# Patient Record
Sex: Female | Born: 1945 | Race: White | Hispanic: No | Marital: Married | Smoking: Never smoker
Health system: Southern US, Community
[De-identification: ages and names within clinical notes are randomized; demographics above are authoritative.]

## PROBLEM LIST (undated history)

## (undated) ENCOUNTER — Ambulatory Visit: Discharge status: Absent without leave | Payer: Medicare Other

## (undated) DIAGNOSIS — M810 Age-related osteoporosis without current pathological fracture: Secondary | ICD-10-CM

## (undated) DIAGNOSIS — I341 Nonrheumatic mitral (valve) prolapse: Secondary | ICD-10-CM

## (undated) DIAGNOSIS — Z9889 Other specified postprocedural states: Secondary | ICD-10-CM

## (undated) DIAGNOSIS — N952 Postmenopausal atrophic vaginitis: Secondary | ICD-10-CM

## (undated) DIAGNOSIS — R609 Edema, unspecified: Secondary | ICD-10-CM

## (undated) DIAGNOSIS — R112 Nausea with vomiting, unspecified: Secondary | ICD-10-CM

## (undated) DIAGNOSIS — M1990 Unspecified osteoarthritis, unspecified site: Secondary | ICD-10-CM

## (undated) HISTORY — PX: TONSILLECTOMY AND ADENOIDECTOMY: SHX28

## (undated) HISTORY — DX: Age-related osteoporosis without current pathological fracture: M81.0

## (undated) HISTORY — DX: Edema, unspecified: R60.9

## (undated) HISTORY — DX: Nonrheumatic mitral (valve) prolapse: I34.1

## (undated) HISTORY — DX: Postmenopausal atrophic vaginitis: N95.2

---

## 1989-07-24 HISTORY — PX: CHOLECYSTECTOMY, LAPAROSCOPIC: SHX56

## 1998-06-22 ENCOUNTER — Other Ambulatory Visit: Admission: RE | Admit: 1998-06-22 | Discharge: 1998-06-22 | Payer: Self-pay | Admitting: Obstetrics and Gynecology

## 1999-07-20 ENCOUNTER — Other Ambulatory Visit: Admission: RE | Admit: 1999-07-20 | Discharge: 1999-07-20 | Payer: Self-pay | Admitting: Obstetrics and Gynecology

## 1999-12-01 ENCOUNTER — Ambulatory Visit (HOSPITAL_BASED_OUTPATIENT_CLINIC_OR_DEPARTMENT_OTHER): Admission: RE | Admit: 1999-12-01 | Discharge: 1999-12-01 | Payer: Self-pay | Admitting: Orthopaedic Surgery

## 1999-12-01 HISTORY — PX: SHOULDER SURGERY: SHX246

## 2000-01-03 ENCOUNTER — Other Ambulatory Visit: Admission: RE | Admit: 2000-01-03 | Discharge: 2000-01-03 | Payer: Self-pay | Admitting: Obstetrics and Gynecology

## 2000-08-29 ENCOUNTER — Other Ambulatory Visit: Admission: RE | Admit: 2000-08-29 | Discharge: 2000-08-29 | Payer: Self-pay | Admitting: Obstetrics and Gynecology

## 2001-10-01 ENCOUNTER — Other Ambulatory Visit: Admission: RE | Admit: 2001-10-01 | Discharge: 2001-10-01 | Payer: Self-pay | Admitting: Obstetrics and Gynecology

## 2003-09-01 ENCOUNTER — Other Ambulatory Visit: Admission: RE | Admit: 2003-09-01 | Discharge: 2003-09-01 | Payer: Self-pay | Admitting: Obstetrics and Gynecology

## 2004-11-22 ENCOUNTER — Other Ambulatory Visit: Admission: RE | Admit: 2004-11-22 | Discharge: 2004-11-22 | Payer: Self-pay | Admitting: Obstetrics and Gynecology

## 2005-11-23 ENCOUNTER — Other Ambulatory Visit: Admission: RE | Admit: 2005-11-23 | Discharge: 2005-11-23 | Payer: Self-pay | Admitting: Obstetrics and Gynecology

## 2006-12-05 ENCOUNTER — Other Ambulatory Visit: Admission: RE | Admit: 2006-12-05 | Discharge: 2006-12-05 | Payer: Self-pay | Admitting: Obstetrics and Gynecology

## 2007-12-17 ENCOUNTER — Other Ambulatory Visit: Admission: RE | Admit: 2007-12-17 | Discharge: 2007-12-17 | Payer: Self-pay | Admitting: Obstetrics and Gynecology

## 2007-12-31 ENCOUNTER — Ambulatory Visit (HOSPITAL_COMMUNITY): Admission: RE | Admit: 2007-12-31 | Discharge: 2007-12-31 | Payer: Self-pay | Admitting: Gastroenterology

## 2007-12-31 ENCOUNTER — Encounter (INDEPENDENT_AMBULATORY_CARE_PROVIDER_SITE_OTHER): Payer: Self-pay | Admitting: Gastroenterology

## 2008-04-14 ENCOUNTER — Ambulatory Visit: Payer: Self-pay | Admitting: Obstetrics and Gynecology

## 2008-12-14 ENCOUNTER — Emergency Department (HOSPITAL_COMMUNITY): Admission: EM | Admit: 2008-12-14 | Discharge: 2008-12-14 | Payer: Self-pay | Admitting: Emergency Medicine

## 2009-06-07 ENCOUNTER — Encounter: Payer: Self-pay | Admitting: Obstetrics and Gynecology

## 2009-06-07 ENCOUNTER — Other Ambulatory Visit: Admission: RE | Admit: 2009-06-07 | Discharge: 2009-06-07 | Payer: Self-pay | Admitting: Obstetrics and Gynecology

## 2009-06-07 ENCOUNTER — Ambulatory Visit: Payer: Self-pay | Admitting: Obstetrics and Gynecology

## 2010-06-08 ENCOUNTER — Other Ambulatory Visit: Admission: RE | Admit: 2010-06-08 | Discharge: 2010-06-08 | Payer: Self-pay | Admitting: Obstetrics and Gynecology

## 2010-06-08 ENCOUNTER — Ambulatory Visit: Payer: Self-pay | Admitting: Obstetrics and Gynecology

## 2010-07-27 ENCOUNTER — Ambulatory Visit
Admission: RE | Admit: 2010-07-27 | Discharge: 2010-07-27 | Payer: Self-pay | Source: Home / Self Care | Attending: Obstetrics and Gynecology | Admitting: Obstetrics and Gynecology

## 2010-12-06 NOTE — Op Note (Signed)
NAMELILLAN, MCCREADIE               ACCOUNT NO.:  [id number]   MEDICAL RECORD NO.:  [id number]          PATIENT TYPE:  AMB   LOCATION:  ENDO                         FACILITY:  [facility]   PHYSICIAN:  Shirley Friar, MDDATE OF BIRTH:  Jun 21, 1946   DATE OF PROCEDURE:  12/31/2007  DATE OF DISCHARGE:                               OPERATIVE REPORT   INDICATIONS:  Heme-positive stool.   MEDICATIONS:  Fentanyl 150 mcg IV and Versed 10 mg IV.   FINDINGS:  Rectal exam was normal.  A pediatric Pentax colonoscope was  inserted to a well-prepped colon and advanced to the cecum where  ileocecal valve and appendiceal orifice were identified.  In order to  reach the cecum, repeated loop reduction was necessary and abdominal  pressure had to be applied at times.  The patient also had to be moved  into the supine position and then back into the left lateral decubitus  position.  Her colon was tortuous and excessive looping occurred.  The  terminal ileum was intubated, and there was a small 1-mm shallow  ulceration with a small amount of surrounding erythema, but without any  edema seen.  The remaining part of the terminal ileum appeared normal.  Two biopsies were taken in the terminal ileum.  On careful withdrawal of  the colonoscope, no other mucosal abnormalities were seen.  Retroflexion  showed small internal hemorrhoids.   ASSESSMENT:  1. Small terminal ileal ulceration, question aspirin related, status      post biopsies of terminal ileal mucosa.  2. Small internal hemorrhoids - suspect heme-positive stools secondary      to internal hemorrhoids.   PLAN:  1. Follow up on pathology.  2. Repeat colonoscopy in 5 years due to the family history of rectal      cancer and colon polyps.      Shirley Friar, MD  Electronically Signed     VCS/MEDQ  D:  12/31/2007  T:  01/01/2008  Job:  [id number]   cc:   Reuel Boom L. Eda Paschal, M.D.

## 2010-12-09 NOTE — Op Note (Signed)
[place]  Patient:    Lauren Thompson, Lauren Thompson                      MRN: [id number] Proc. Date: 12/01/99 Adm. Date:  [id number] Disc. Date: [id number] Attending:  Marcene Corning                           Operative Report  PREOPERATIVE DIAGNOSES: 1. Right shoulder impingement. 2. Right shoulder partial rotator cuff tear.  POSTOPERATIVE DIAGNOSES: 1. Right shoulder impingement. 2. Right shoulder partial rotator cuff tear.  PROCEDURES: 1. Right shoulder arthroscopic acromioplasty. 2. Right shoulder arthroscopic debridement.  ANESTHESIA:  General.  ATTENDING SURGEON:  Lubertha Basque. Jerl Santos, M.D.  ASSISTANT:  Prince Rome, P.A.  INDICATIONS:  The patient is a 65 year old woman with about a year of right shoulder pain of insidious onset.  She has persisted with difficulty despite activity restriction and oral anti-inflammatories.  She has responded in a transient fashion to two subacromial injections, but at this point has continued pain.  At this point, she offered an arthroscopy.  The procedure was discussed with the patient and informed operative consent was obtained after discussion of possible complications of reaction to anesthesia and infection.   DESCRIPTION OF PROCEDURE:  The patient was taken to the operating suite, where general anesthetic was applied without difficulty.  She was positioned in beach chair position and prepped and draped in normal sterile fashion.  After administration of preoperative antibiotics, an arthroscopy of the right shoulder was performed through a total of two portals.  Glenohumeral joint was benign with no degenerative change.  Biceps anchor was well attached, as were other labral structures.  The rotator cuff appeared benign on undersurface inspection.  In the subacromial space, she had some fraying of the rotator cuff, but no full-thickness tear.  A brief debridement was performed.  She had a large  subacromial spur, which was addressed with an arthroscopic subacromial decompression back to a flat surface.  This was done with the bur in the lateral position, followed by transfer of the bur to the posterior position. The [facility] joint appeared benign and there was no significant spurring underneath the structure, so nothing was addressed here.  The cuff was again examined and the shoulder was thoroughly irrigated.  Marcaine with epinephrine and morphine were injected at the end of the case, followed by loose approximation of the two portals with simple sutures of nylon.  Adaptic was placed on the wound, followed by dry gauze and tape.  Estimated blood loss and intraoperative fluids can be obtained from anesthesia records.  DISPOSITION:  The patient was extubated in the operating room and taken to recovery room in stable condition.  PLANS:  For her to go home same day and follow up in the office in less than a week.  I will contact her by phone tonight. DD:  12/01/99 TD:  12/03/99 Job: [id number]

## 2011-05-29 ENCOUNTER — Telehealth: Payer: Self-pay | Admitting: *Deleted

## 2011-05-29 DIAGNOSIS — N390 Urinary tract infection, site not specified: Secondary | ICD-10-CM

## 2011-05-29 MED ORDER — NITROFURANTOIN MONOHYD MACRO 100 MG PO CAPS: 100.0000 mg | ORAL_CAPSULE | Freq: Two times a day (BID) | ORAL | Status: AC

## 2011-05-29 NOTE — Telephone Encounter (Signed)
If not allergic Macrobid twice a day with food for 7 days. Patient needs followup urinalysis.

## 2011-05-29 NOTE — Telephone Encounter (Signed)
Patient informed.  RX sent, order for U/A in pc.

## 2011-05-29 NOTE — Telephone Encounter (Signed)
Patient called c/o frequency, urine odor, etc.  Would like an rx called in if possible.

## 2011-06-30 ENCOUNTER — Other Ambulatory Visit: Payer: Self-pay | Admitting: *Deleted

## 2011-06-30 ENCOUNTER — Other Ambulatory Visit: Payer: BC Managed Care – PPO | Admitting: Gynecology

## 2011-06-30 DIAGNOSIS — N390 Urinary tract infection, site not specified: Secondary | ICD-10-CM

## 2011-06-30 MED ORDER — ZOLPIDEM TARTRATE 10 MG PO TABS: 10.0000 mg | ORAL_TABLET | Freq: Every evening | ORAL | Status: DC | PRN

## 2011-06-30 NOTE — Telephone Encounter (Signed)
rx called in

## 2011-07-03 ENCOUNTER — Telehealth: Payer: Self-pay | Admitting: *Deleted

## 2011-07-03 NOTE — Telephone Encounter (Signed)
Patient called for u/a results from 06/30/11. All negative.

## 2011-08-08 ENCOUNTER — Encounter: Payer: Self-pay | Admitting: Obstetrics and Gynecology

## 2011-08-11 ENCOUNTER — Other Ambulatory Visit: Payer: Self-pay | Admitting: Obstetrics and Gynecology

## 2011-09-07 DIAGNOSIS — N952 Postmenopausal atrophic vaginitis: Secondary | ICD-10-CM | POA: Insufficient documentation

## 2011-09-07 DIAGNOSIS — M8580 Other specified disorders of bone density and structure, unspecified site: Secondary | ICD-10-CM | POA: Insufficient documentation

## 2011-09-15 ENCOUNTER — Ambulatory Visit (INDEPENDENT_AMBULATORY_CARE_PROVIDER_SITE_OTHER): Payer: BC Managed Care – PPO | Admitting: Obstetrics and Gynecology

## 2011-09-15 ENCOUNTER — Encounter: Payer: Self-pay | Admitting: Obstetrics and Gynecology

## 2011-09-15 VITALS — BP 120/70 | Ht 64.5 in | Wt 125.0 lb

## 2011-09-15 DIAGNOSIS — Z01419 Encounter for gynecological examination (general) (routine) without abnormal findings: Secondary | ICD-10-CM

## 2011-09-15 DIAGNOSIS — M8580 Other specified disorders of bone density and structure, unspecified site: Secondary | ICD-10-CM

## 2011-09-15 DIAGNOSIS — Z833 Family history of diabetes mellitus: Secondary | ICD-10-CM

## 2011-09-15 DIAGNOSIS — M899 Disorder of bone, unspecified: Secondary | ICD-10-CM

## 2011-09-15 DIAGNOSIS — I341 Nonrheumatic mitral (valve) prolapse: Secondary | ICD-10-CM | POA: Insufficient documentation

## 2011-09-15 NOTE — Progress Notes (Signed)
Patient came to see me today for her annual GYN exam. She is not having hot flashes. She is not having vaginal bleeding. She is having no pelvic pain. She is up-to-date on her mammograms but we did not receive her last one. She is due for followup bone density for osteopenia. She has been dry vaginally with intercourse but her husband has had prostate cancer at the moment she is not sexually active.  Physical examination:  Kennon Portela present. HEENT within normal limits. Neck: Thyroid not large. No masses. Supraclavicular nodes: not enlarged. Breasts: Examined in both sitting midline position. No skin changes and no masses. Abdomen: Soft no guarding rebound or masses or hernia. Pelvic: External: Within normal limits. BUS: Within normal limits. Vaginal:within normal limits. Poor estrogen effect. No evidence of cystocele rectocele or enterocele. Cervix: clean. Uterus: Normal size and shape. Adnexa: No masses. Rectovaginal exam: Confirmatory and negative. Extremities: Within normal limits.  Assessment: #1. Atrophic vaginitis #2. Osteopenia  Plan: Patient to get me her mammogram report. Bone density ordered.

## 2011-09-16 LAB — CBC WITH DIFFERENTIAL/PLATELET
Basophils Absolute: 0 10*3/uL (ref 0.0–0.1)
Eosinophils Absolute: 0 10*3/uL (ref 0.0–0.7)
Eosinophils Relative: 0 % (ref 0–5)
Lymphocytes Relative: 23 % (ref 12–46)
Lymphs Abs: 2 10*3/uL (ref 0.7–4.0)
MCH: 27.8 pg (ref 26.0–34.0)
Neutrophils Relative %: 70 % (ref 43–77)
Platelets: 248 10*3/uL (ref 150–400)
RBC: 4.54 MIL/uL (ref 3.87–5.11)
RDW: 13.3 % (ref 11.5–15.5)
WBC: 8.4 10*3/uL (ref 4.0–10.5)

## 2011-09-16 LAB — URINALYSIS W MICROSCOPIC + REFLEX CULTURE
Casts: NONE SEEN
Crystals: NONE SEEN
Leukocytes, UA: NEGATIVE
Nitrite: NEGATIVE
Specific Gravity, Urine: 1.02 (ref 1.005–1.030)
Urobilinogen, UA: 0.2 mg/dL (ref 0.0–1.0)
pH: 6 (ref 5.0–8.0)

## 2011-09-16 LAB — LIPID PANEL
HDL: 88 mg/dL (ref >39)
Total CHOL/HDL Ratio: 2.3 Ratio
VLDL: 8 mg/dL (ref 0–40)

## 2011-09-16 LAB — HEMOGLOBIN A1C: Hgb A1c MFr Bld: 5.3 % (ref <5.7)

## 2011-09-21 ENCOUNTER — Telehealth: Payer: Self-pay | Admitting: *Deleted

## 2011-09-21 NOTE — Telephone Encounter (Signed)
Lm for patient.  Wants to discuss lab results

## 2011-09-22 NOTE — Telephone Encounter (Signed)
Went over results with patient. Questions answered

## 2011-09-26 ENCOUNTER — Ambulatory Visit (INDEPENDENT_AMBULATORY_CARE_PROVIDER_SITE_OTHER): Payer: BC Managed Care – PPO

## 2011-09-26 DIAGNOSIS — M899 Disorder of bone, unspecified: Secondary | ICD-10-CM

## 2011-09-26 DIAGNOSIS — M949 Disorder of cartilage, unspecified: Secondary | ICD-10-CM

## 2011-09-26 DIAGNOSIS — M8580 Other specified disorders of bone density and structure, unspecified site: Secondary | ICD-10-CM

## 2011-11-30 ENCOUNTER — Other Ambulatory Visit: Payer: Self-pay | Admitting: Obstetrics and Gynecology

## 2011-12-13 ENCOUNTER — Telehealth: Payer: Self-pay | Admitting: *Deleted

## 2011-12-13 MED ORDER — TRIAMTERENE-HCTZ 37.5-25 MG PO CAPS: 1.0000 | ORAL_CAPSULE | Freq: Every day | ORAL | Status: DC

## 2011-12-13 NOTE — Telephone Encounter (Signed)
Pt left message on 12/11/11 request rx for fluid pill? Left message on voicemail for pt to call.

## 2011-12-13 NOTE — Telephone Encounter (Signed)
Addended by: Aura Camps on: 12/13/2011 04:25 PM   Modules accepted: Orders

## 2011-12-13 NOTE — Telephone Encounter (Signed)
Dyazide 1 daily when necessary. Give #30 with no refills.

## 2011-12-13 NOTE — Telephone Encounter (Signed)
rx sent to pharmacy

## 2011-12-13 NOTE — Telephone Encounter (Signed)
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 Pt said that you said a fluid pill would help with her ankles. She is calling requesting to start on a pill, I asked her about PCP doctor and pt has not seen him in sometime. Please advise

## 2012-04-09 ENCOUNTER — Other Ambulatory Visit: Payer: Self-pay | Admitting: Women's Health

## 2012-04-09 NOTE — Telephone Encounter (Signed)
Lauren Thompson, refill request came from [facility] via fax.  In system I see where you prescribed #30 with no refills last Dec.  The fax says "Last refill 10/04/2011".

## 2012-04-10 MED ORDER — ZOLPIDEM TARTRATE 10 MG PO TABS: 10.0000 mg | ORAL_TABLET | Freq: Every evening | ORAL | Status: DC | PRN

## 2012-04-10 NOTE — Telephone Encounter (Signed)
Okay to refill, she may have gotten filled in March and not in December. Please call in Xanax 0.25 every 8 hours when necessary #30 no refills.

## 2012-04-10 NOTE — Telephone Encounter (Signed)
I had handwritten note from [place] to disregard Rx for Xanax. Xanax was NOT called in.

## 2013-09-08 DIAGNOSIS — Z1231 Encounter for screening mammogram for malignant neoplasm of breast: Secondary | ICD-10-CM | POA: Diagnosis not present

## 2013-09-10 DIAGNOSIS — B351 Tinea unguium: Secondary | ICD-10-CM | POA: Diagnosis not present

## 2013-09-10 DIAGNOSIS — L259 Unspecified contact dermatitis, unspecified cause: Secondary | ICD-10-CM | POA: Diagnosis not present

## 2013-09-10 DIAGNOSIS — L819 Disorder of pigmentation, unspecified: Secondary | ICD-10-CM | POA: Diagnosis not present

## 2013-09-22 ENCOUNTER — Ambulatory Visit: Payer: Self-pay | Admitting: Nurse Practitioner

## 2013-10-06 ENCOUNTER — Encounter: Payer: Self-pay | Admitting: Nurse Practitioner

## 2013-10-06 ENCOUNTER — Ambulatory Visit (INDEPENDENT_AMBULATORY_CARE_PROVIDER_SITE_OTHER): Payer: Medicare Other | Admitting: Nurse Practitioner

## 2013-10-06 VITALS — BP 116/78 | HR 60 | Ht 65.0 in | Wt 130.0 lb

## 2013-10-06 DIAGNOSIS — Z0000 Encounter for general adult medical examination without abnormal findings: Secondary | ICD-10-CM

## 2013-10-06 DIAGNOSIS — E559 Vitamin D deficiency, unspecified: Secondary | ICD-10-CM

## 2013-10-06 DIAGNOSIS — Z01419 Encounter for gynecological examination (general) (routine) without abnormal findings: Secondary | ICD-10-CM

## 2013-10-06 DIAGNOSIS — Z124 Encounter for screening for malignant neoplasm of cervix: Secondary | ICD-10-CM | POA: Diagnosis not present

## 2013-10-06 LAB — POCT URINALYSIS DIPSTICK
BILIRUBIN UA: NEGATIVE
Blood, UA: NEGATIVE
GLUCOSE UA: NEGATIVE
Ketones, UA: NEGATIVE
LEUKOCYTES UA: NEGATIVE
NITRITE UA: NEGATIVE
Protein, UA: NEGATIVE
Urobilinogen, UA: NEGATIVE
pH, UA: 7

## 2013-10-06 MED ORDER — VITAMIN D (ERGOCALCIFEROL) 1.25 MG (50000 UNIT) PO CAPS: ORAL_CAPSULE | ORAL | Status: AC

## 2013-10-06 NOTE — Progress Notes (Signed)
Patient ID: Lauren Thompson, female   DOB: 04/13/1946, 68 y.o.   MRN: [id number] 68 y.o. [id number] Married Caucasian Fe here for annual exam.  Husband with prostate cancer 3 years. No other new health concerns.  She and her husband are going to [place] for a week course this summer and will include some travel.  Not SA at present. At PCP [facility] she was down in weight to 123 lbs. She has tried to gain a few pounds since then.   Patient's last menstrual period was 07/24/2000.          Sexually active: yes  The current method of family planning is post menopausal status.    Exercising: yes  walking at least 2 miles everyday Smoker:  no  Health Maintenance: Pap:   09/17/12, normal with negative HR HPV MMG:  09/08/13, 3 D: negative Colonoscopy:  12/2007, repeat in 5 years - scheduled 10/15/13 BMD:  09/26/11, Low Bone Mass TDaP:  2010 Labs:  HB:  12.6  Urine:  negative   reports that she has never smoked. She has never used smokeless tobacco. She reports that she drinks alcohol.  Past Medical History  Diagnosis Date  . Atrophic vaginitis   . Osteopenia   . MVP (mitral valve prolapse)     Antibiotics are NOT require for procedures  . Idiopathic edema     fluid retention    Past Surgical History  Procedure Laterality Date  . Tonsillectomy and adenoidectomy  childhood  . Cholecystectomy, laparoscopic  1991  . Shoulder surgery Right 12/01/1999    Current Outpatient Prescriptions  Medication Sig Dispense Refill  . aspirin 81 MG tablet Take 81 mg by mouth daily.      . Calcium Carbonate-Vitamin D (CALCIUM-D PO) Take by mouth.      . triamterene-hydrochlorothiazide (DYAZIDE) 37.5-25 MG per capsule Take 1 capsule by mouth daily as needed (swelling).      . Vitamin D, Ergocalciferol, (DRISDOL) 50000 UNITS CAPS capsule TAKE 1 CAPSULE BY MOUTH ONCE WEEKLY  30 capsule  3   No current facility-administered medications for this visit.    Family History  Problem Relation Age of Onset  . Diabetes  Mother   . Hypertension Mother   . Stroke Mother   . Heart disease Father   . Colon cancer Father   . Alzheimer's disease Father   . Stroke Maternal Grandmother   . Diabetes Paternal Grandmother     ROS:  Pertinent items are noted in HPI.  Otherwise, a comprehensive ROS was negative.  Exam:   BP 116/78  Pulse 60  Ht 5\' 5"  (1.651 m)  Wt 130 lb (58.968 kg)  BMI 21.63 kg/m2  LMP 07/24/2000 Height: 5\' 5"  (165.1 cm)  Ht Readings from Last 3 Encounters:  10/06/13 5\' 5"  (1.651 m)  09/15/11 5' 4.5" (1.638 m)    General appearance: alert, cooperative and appears stated age Head: Normocephalic, without obvious abnormality, atraumatic Neck: no adenopathy, supple, symmetrical, trachea midline and thyroid normal to inspection and palpation Lungs: clear to auscultation bilaterally Breasts: normal appearance, no masses or tenderness Heart: regular rate and rhythm Abdomen: soft, non-tender; no masses,  no organomegaly Extremities: extremities normal, atraumatic, no cyanosis or edema Skin: Skin color, texture, turgor normal. No rashes or lesions Lymph nodes: Cervical, supraclavicular, and axillary nodes normal. No abnormal inguinal nodes palpated Neurologic: Grossly normal   Pelvic: External genitalia:  no lesions              Urethra:  normal appearing urethra with no masses, tenderness or lesions              Bartholin's and Skene's: normal                 Vagina: very atrophic appearing vagina with pale color and discharge, no lesions;  had to use smallest speculum and still uncomfortable with exam.              Cervix: anteverted              Pap taken: no Bimanual Exam:  Uterus:  normal size, contour, position, consistency, mobility, non-tender              Adnexa: no mass, fullness, tenderness               Rectovaginal: Confirms               Anus:  normal sphincter tone, no lesions  A:  Well Woman with normal exam  Postmenopausal no HRT  Atrophic vaginitis declines  treatment  Vit D deficiency  P:   Pap smear as per guidelines   Mammogram due 08/2014  Refill Vit D and follow with results  Counseled on breast self exam, mammography screening, adequate intake of calcium and vitamin D, diet and exercise, Kegel's exercises return annually or prn  An After Visit Summary was printed and given to the patient.

## 2013-10-06 NOTE — Patient Instructions (Signed)

## 2013-10-07 LAB — HEMOGLOBIN, FINGERSTICK: Hemoglobin, fingerstick: 12.6 g/dL (ref 12.0–16.0)

## 2013-10-07 LAB — VITAMIN D 25 HYDROXY (VIT D DEFICIENCY, FRACTURES): VIT D 25 HYDROXY: 26 ng/mL — AB (ref 30–89)

## 2013-10-07 NOTE — Progress Notes (Signed)
Encounter reviewed by Dr. Benicia Bergevin Silva.  

## 2013-10-15 ENCOUNTER — Telehealth: Payer: Self-pay | Admitting: *Deleted

## 2013-10-15 DIAGNOSIS — K621 Rectal polyp: Secondary | ICD-10-CM | POA: Diagnosis not present

## 2013-10-15 DIAGNOSIS — D126 Benign neoplasm of colon, unspecified: Secondary | ICD-10-CM | POA: Diagnosis not present

## 2013-10-15 DIAGNOSIS — D128 Benign neoplasm of rectum: Secondary | ICD-10-CM | POA: Diagnosis not present

## 2013-10-15 DIAGNOSIS — K620 Anal polyp: Secondary | ICD-10-CM | POA: Diagnosis not present

## 2013-10-15 DIAGNOSIS — Z800 Family history of malignant neoplasm of digestive organs: Secondary | ICD-10-CM | POA: Diagnosis not present

## 2013-10-15 DIAGNOSIS — Z1211 Encounter for screening for malignant neoplasm of colon: Secondary | ICD-10-CM | POA: Diagnosis not present

## 2013-10-15 DIAGNOSIS — D129 Benign neoplasm of anus and anal canal: Secondary | ICD-10-CM | POA: Diagnosis not present

## 2013-10-15 NOTE — Telephone Encounter (Signed)
I have attempted to contact this patient by phone with the following results: left message to return my call on answering machine (home per DPR).  

## 2013-10-15 NOTE — Telephone Encounter (Signed)
Message copied by Graylon Good on Wed Oct 15, 2013 10:04 AM ------      Message from: Kem Boroughs R      Created: Tue Oct 07, 2013  8:19 AM       Let patient know that Vit D is quite low - she had been out of meds for a month.  She needs to restart weekly and RX already given. ------

## 2013-10-15 NOTE — Telephone Encounter (Signed)
Pt notified of results.  Pt voices understanding and is agreeable with plan.  Verified RX has been called to [facility].  She will pick up from there.

## 2013-12-01 DIAGNOSIS — B079 Viral wart, unspecified: Secondary | ICD-10-CM | POA: Diagnosis not present

## 2013-12-01 DIAGNOSIS — D485 Neoplasm of uncertain behavior of skin: Secondary | ICD-10-CM | POA: Diagnosis not present

## 2013-12-01 DIAGNOSIS — B351 Tinea unguium: Secondary | ICD-10-CM | POA: Diagnosis not present

## 2014-01-01 DIAGNOSIS — H2510 Age-related nuclear cataract, unspecified eye: Secondary | ICD-10-CM | POA: Diagnosis not present

## 2014-01-01 DIAGNOSIS — H43399 Other vitreous opacities, unspecified eye: Secondary | ICD-10-CM | POA: Diagnosis not present

## 2014-02-23 DIAGNOSIS — H524 Presbyopia: Secondary | ICD-10-CM | POA: Diagnosis not present

## 2014-03-25 ENCOUNTER — Ambulatory Visit: Payer: Medicare Other | Admitting: Certified Nurse Midwife

## 2014-04-21 ENCOUNTER — Encounter (HOSPITAL_BASED_OUTPATIENT_CLINIC_OR_DEPARTMENT_OTHER): Payer: Self-pay | Admitting: *Deleted

## 2014-04-21 ENCOUNTER — Other Ambulatory Visit: Payer: Self-pay | Admitting: Orthopedic Surgery

## 2014-04-21 DIAGNOSIS — M19049 Primary osteoarthritis, unspecified hand: Secondary | ICD-10-CM | POA: Diagnosis not present

## 2014-04-24 ENCOUNTER — Encounter (HOSPITAL_BASED_OUTPATIENT_CLINIC_OR_DEPARTMENT_OTHER)
Admission: RE | Admit: 2014-04-24 | Discharge: 2014-04-24 | Disposition: A | Discharge status: Home / Self Care | Payer: Medicare Other | Source: Ambulatory Visit | Attending: Orthopedic Surgery | Admitting: Orthopedic Surgery

## 2014-04-24 DIAGNOSIS — I341 Nonrheumatic mitral (valve) prolapse: Secondary | ICD-10-CM | POA: Diagnosis not present

## 2014-04-24 DIAGNOSIS — Z882 Allergy status to sulfonamides status: Secondary | ICD-10-CM | POA: Diagnosis not present

## 2014-04-24 DIAGNOSIS — R609 Edema, unspecified: Secondary | ICD-10-CM | POA: Diagnosis not present

## 2014-04-24 DIAGNOSIS — M19041 Primary osteoarthritis, right hand: Secondary | ICD-10-CM | POA: Diagnosis not present

## 2014-04-24 DIAGNOSIS — M8580 Other specified disorders of bone density and structure, unspecified site: Secondary | ICD-10-CM | POA: Diagnosis not present

## 2014-04-24 LAB — BASIC METABOLIC PANEL
Anion gap: 9 (ref 5–15)
BUN: 14 mg/dL (ref 6–23)
CO2: 29 mEq/L (ref 19–32)
CREATININE: 0.7 mg/dL (ref 0.50–1.10)
Calcium: 9 mg/dL (ref 8.4–10.5)
Chloride: 103 mEq/L (ref 96–112)
GFR, EST NON AFRICAN AMERICAN: 88 mL/min — AB (ref >90)
Glucose, Bld: 81 mg/dL (ref 70–99)
Potassium: 4.4 mEq/L (ref 3.7–5.3)
Sodium: 141 mEq/L (ref 137–147)

## 2014-04-24 NOTE — H&P (Signed)
Lauren Thompson is an 68 y.o. female.   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  She has developed progressive pain in the DIP joint of the finger as well as some enlargement and radial deviation.  It has been chronic and progressive over 3 years.  She uses ibuprofen intermittently.  There are times when she is really bothered by pain in the digit when it is bumped or caught unexpectedly.  She has also begun to develop some enlargement about the left small finger DIP joints as well.  Past Medical History  Diagnosis Date  . Atrophic vaginitis   . Osteopenia   . MVP (mitral valve prolapse)     Antibiotics are NOT require for procedures  . Idiopathic edema     fluid retention  . PONV (postoperative nausea and vomiting)   . Arthritis     rt index finger    Past Surgical History  Procedure Laterality Date  . Tonsillectomy and adenoidectomy  childhood  . Cholecystectomy, laparoscopic  1991  . Shoulder surgery Right 12/01/1999  . Tonsillectomy    . Cholecystectomy      Family History  Problem Relation Age of Onset  . Diabetes Mother   . Hypertension Mother   . Stroke Mother   . Heart disease Father   . Colon cancer Father   . Alzheimer's disease Father   . Stroke Maternal Grandmother   . Diabetes Paternal Grandmother    Social History:  reports that she has never smoked. She has never used smokeless tobacco. She reports that she drinks alcohol. She reports that she does not use illicit drugs.  Allergies:  Allergies  Allergen Reactions  . Sulfa Antibiotics     No prescriptions prior to admission    Results for orders placed during the hospital encounter of 04/27/14 (from the past 48 hour(s))  BASIC METABOLIC PANEL     Status: Abnormal   Collection Time    04/24/14  9:30 AM      Result Value Ref Range   Sodium 141  137 - 147 mEq/L   Potassium 4.4   3.7 - 5.3 mEq/L   Chloride 103  96 - 112 mEq/L   CO2 29  19 - 32 mEq/L   Glucose, Bld 81  70 - 99 mg/dL   BUN 14  6 - 23 mg/dL   Creatinine, Ser 0.70  0.50 - 1.10 mg/dL   Calcium 9.0  8.4 - 10.5 mg/dL   GFR calc non Af Amer 88 (*) >90 mL/min   GFR calc Af Amer >90  >90 mL/min   Comment: (NOTE)     The eGFR has been calculated using the CKD EPI equation.     This calculation has not been validated in all clinical situations.     eGFR's persistently <90 mL/min signify possible Chronic Kidney     Disease.   Anion gap 9  5 - 15   No results found.  Review of Systems  All other systems reviewed and are negative.   Height _0  (1.676 m), weight 57.153 kg (126 lb), last menstrual period 07/24/2000. Physical Exam  Constitutional:  WD, WN, NAD HEENT:  NCAT, EOMI Neuro/Psych:  Alert & oriented to person, place, and time; appropriate mood & affect Lymphatic: No generalized UE edema or lymphadenopathy Extremities / MSK:  Both UE are normal with respect to appearance,  ranges of motion, joint stability, muscle strength/tone, sensation, & perfusion except as otherwise noted:  Right index finger DIP joint is enlarged and a characteristic manner for osteoarthritis.  There is roughly 6-7 of radial angulation and range of motion is just from 0-35.  Tip pinch: Right 3/left 7  Labs / Xrays:  3 views of the right index finger ordered and obtained today reveal extensive osteophytic change, with extensive osteophyte formation, joint space narrowing, and articular destruction.  Assessment: Right index finger painful DIP osteoarthritis, with accompanying deformity  Plan:  I discussed options with her that included nonsteroidals, and intermittent injections, and DIP fusion.  After deliberation of the options and considerations of the details of each, she indicated she wished to proceed with DIP fusion.  She understands the extent of postoperative splinting and the retained longitudinal screw within the  bones.  She transitioned to our surgery scheduler today to further plan conducting this procedure.    The details of the operative procedure were discussed with the patient.  Questions were invited and answered.  In addition to the goal of the procedure, the risks of the procedure to include but not limited to bleeding; infection; damage to the nerves or blood vessels that could result in bleeding, numbness, weakness, chronic pain, and the need for additional procedures; stiffness; the need for revision surgery; and anesthetic risks, the worst of which is death.  No specific outcome was guaranteed or implied.  Informed consent was obtained.   Lauren Thompson A. 04/24/2014, 2:59 PM

## 2014-04-27 ENCOUNTER — Encounter (HOSPITAL_BASED_OUTPATIENT_CLINIC_OR_DEPARTMENT_OTHER)
Admission: RE | Disposition: A | Discharge status: Home / Self Care | Payer: Self-pay | Source: Ambulatory Visit | Attending: Orthopedic Surgery

## 2014-04-27 ENCOUNTER — Ambulatory Visit (HOSPITAL_BASED_OUTPATIENT_CLINIC_OR_DEPARTMENT_OTHER): Payer: Medicare Other | Admitting: Certified Registered"

## 2014-04-27 ENCOUNTER — Encounter (HOSPITAL_BASED_OUTPATIENT_CLINIC_OR_DEPARTMENT_OTHER): Payer: Self-pay

## 2014-04-27 ENCOUNTER — Encounter (HOSPITAL_BASED_OUTPATIENT_CLINIC_OR_DEPARTMENT_OTHER): Payer: Medicare Other | Admitting: Certified Registered"

## 2014-04-27 ENCOUNTER — Ambulatory Visit (HOSPITAL_BASED_OUTPATIENT_CLINIC_OR_DEPARTMENT_OTHER)
Admission: RE | Admit: 2014-04-27 | Discharge: 2014-04-27 | Disposition: A | Discharge status: Home / Self Care | Payer: Medicare Other | Source: Ambulatory Visit | Attending: Orthopedic Surgery | Admitting: Orthopedic Surgery

## 2014-04-27 ENCOUNTER — Ambulatory Visit (HOSPITAL_COMMUNITY): Payer: Medicare Other

## 2014-04-27 DIAGNOSIS — M8580 Other specified disorders of bone density and structure, unspecified site: Secondary | ICD-10-CM | POA: Diagnosis not present

## 2014-04-27 DIAGNOSIS — Z472 Encounter for removal of internal fixation device: Secondary | ICD-10-CM | POA: Diagnosis not present

## 2014-04-27 DIAGNOSIS — Z882 Allergy status to sulfonamides status: Secondary | ICD-10-CM | POA: Insufficient documentation

## 2014-04-27 DIAGNOSIS — R609 Edema, unspecified: Secondary | ICD-10-CM | POA: Insufficient documentation

## 2014-04-27 DIAGNOSIS — M19041 Primary osteoarthritis, right hand: Secondary | ICD-10-CM | POA: Insufficient documentation

## 2014-04-27 DIAGNOSIS — I341 Nonrheumatic mitral (valve) prolapse: Secondary | ICD-10-CM | POA: Diagnosis not present

## 2014-04-27 HISTORY — PX: DISTAL INTERPHALANGEAL JOINT FUSION: SHX6428

## 2014-04-27 HISTORY — DX: Other specified postprocedural states: Z98.890

## 2014-04-27 HISTORY — DX: Unspecified osteoarthritis, unspecified site: M19.90

## 2014-04-27 HISTORY — DX: Nausea with vomiting, unspecified: R11.2

## 2014-04-27 LAB — POCT HEMOGLOBIN-HEMACUE: Hemoglobin: 13.4 g/dL (ref 12.0–15.0)

## 2014-04-27 SURGERY — DISTAL INTERPHALANGEAL JOINT FUSION
Anesthesia: General | Site: Finger | Laterality: Right

## 2014-04-27 MED ORDER — BUPIVACAINE-EPINEPHRINE (PF) 0.5% -1:200000 IJ SOLN
INTRAMUSCULAR | Status: DC | PRN
  Administered 2014-04-27: 10 mL via PERINEURAL

## 2014-04-27 MED ORDER — BUPIVACAINE HCL (PF) 0.5 % IJ SOLN
INTRAMUSCULAR | Status: AC
  Filled 2014-04-27: qty 30

## 2014-04-27 MED ORDER — 0.9 % SODIUM CHLORIDE (POUR BTL) OPTIME
TOPICAL | Status: DC | PRN
  Administered 2014-04-27: 300 mL

## 2014-04-27 MED ORDER — CEFAZOLIN SODIUM-DEXTROSE 2-3 GM-% IV SOLR: 2.0000 g | INTRAVENOUS | Status: DC

## 2014-04-27 MED ORDER — FENTANYL CITRATE 0.05 MG/ML IJ SOLN
INTRAMUSCULAR | Status: AC
  Filled 2014-04-27: qty 6

## 2014-04-27 MED ORDER — CEFAZOLIN SODIUM-DEXTROSE 2-3 GM-% IV SOLR
INTRAVENOUS | Status: DC | PRN
  Administered 2014-04-27: 2 g via INTRAVENOUS

## 2014-04-27 MED ORDER — LACTATED RINGERS IV SOLN: INTRAVENOUS | Status: DC

## 2014-04-27 MED ORDER — EPHEDRINE SULFATE 50 MG/ML IJ SOLN
INTRAMUSCULAR | Status: DC | PRN
  Administered 2014-04-27: 10 mg via INTRAVENOUS

## 2014-04-27 MED ORDER — MIDAZOLAM HCL 2 MG/2ML IJ SOLN: 1.0000 mg | INTRAMUSCULAR | Status: DC | PRN

## 2014-04-27 MED ORDER — MIDAZOLAM HCL 5 MG/5ML IJ SOLN
INTRAMUSCULAR | Status: DC | PRN
  Administered 2014-04-27: 2 mg via INTRAVENOUS

## 2014-04-27 MED ORDER — SCOPOLAMINE 1 MG/3DAYS TD PT72
MEDICATED_PATCH | TRANSDERMAL | Status: AC
  Filled 2014-04-27: qty 1

## 2014-04-27 MED ORDER — FENTANYL CITRATE 0.05 MG/ML IJ SOLN: 50.0000 ug | INTRAMUSCULAR | Status: DC | PRN

## 2014-04-27 MED ORDER — LACTATED RINGERS IV SOLN
INTRAVENOUS | Status: DC
  Administered 2014-04-27 (×2): via INTRAVENOUS

## 2014-04-27 MED ORDER — OXYCODONE HCL 5 MG PO TABS: 5.0000 mg | ORAL_TABLET | Freq: Once | ORAL | Status: DC | PRN

## 2014-04-27 MED ORDER — PROPOFOL 10 MG/ML IV BOLUS
INTRAVENOUS | Status: DC | PRN
  Administered 2014-04-27: 120 mg via INTRAVENOUS

## 2014-04-27 MED ORDER — LIDOCAINE HCL (CARDIAC) 20 MG/ML IV SOLN
INTRAVENOUS | Status: DC | PRN
  Administered 2014-04-27: 50 mg via INTRAVENOUS

## 2014-04-27 MED ORDER — MIDAZOLAM HCL 2 MG/2ML IJ SOLN
INTRAMUSCULAR | Status: AC
  Filled 2014-04-27: qty 2

## 2014-04-27 MED ORDER — BUPIVACAINE-EPINEPHRINE (PF) 0.5% -1:200000 IJ SOLN
INTRAMUSCULAR | Status: AC
  Filled 2014-04-27: qty 30

## 2014-04-27 MED ORDER — ONDANSETRON HCL 4 MG/2ML IJ SOLN
INTRAMUSCULAR | Status: DC | PRN
  Administered 2014-04-27: 4 mg via INTRAVENOUS

## 2014-04-27 MED ORDER — OXYCODONE HCL 5 MG/5ML PO SOLN: 5.0000 mg | Freq: Once | ORAL | Status: DC | PRN

## 2014-04-27 MED ORDER — SCOPOLAMINE 1 MG/3DAYS TD PT72SCOPOLAMINE 1 MG/3DAYS
1.0000 | MEDICATED_PATCH | Freq: Once | TRANSDERMAL | Status: DC
Start: 2014-04-27 — End: 2014-04-27
  Administered 2014-04-27: 1.5 mg via TRANSDERMAL

## 2014-04-27 MED ORDER — DEXAMETHASONE SODIUM PHOSPHATE 4 MG/ML IJ SOLN
INTRAMUSCULAR | Status: DC | PRN
  Administered 2014-04-27: 10 mg via INTRAVENOUS

## 2014-04-27 MED ORDER — HYDROMORPHONE HCL 1 MG/ML IJ SOLN: 0.2500 mg | INTRAMUSCULAR | Status: DC | PRN

## 2014-04-27 MED ORDER — HYDROCODONE-ACETAMINOPHEN 5-325 MG PO TABS: 1.0000 | ORAL_TABLET | ORAL | Status: DC | PRN

## 2014-04-27 MED ORDER — LIDOCAINE HCL (PF) 1 % IJ SOLN
INTRAMUSCULAR | Status: AC
  Filled 2014-04-27: qty 30

## 2014-04-27 MED ORDER — FENTANYL CITRATE 0.05 MG/ML IJ SOLN
INTRAMUSCULAR | Status: DC | PRN
  Administered 2014-04-27 (×2): 50 ug via INTRAVENOUS

## 2014-04-27 SURGICAL SUPPLY — 57 items
BANDAGE COBAN STERILE 2 (GAUZE/BANDAGES/DRESSINGS) IMPLANT
BLADE AVERAGE 25MMX9MM (BLADE)
BLADE AVERAGE 25X9 (BLADE) IMPLANT
BLADE MINI RND TIP GREEN BEAV (BLADE) ×3 IMPLANT
BLADE SURG 15 STRL LF DISP TIS (BLADE) ×1 IMPLANT
BLADE SURG 15 STRL SS (BLADE) ×2
BNDG COHESIVE 1X5 TAN STRL LF (GAUZE/BANDAGES/DRESSINGS) ×3 IMPLANT
BNDG COHESIVE 4X5 TAN STRL (GAUZE/BANDAGES/DRESSINGS) IMPLANT
BNDG CONFORM 2 STRL LF (GAUZE/BANDAGES/DRESSINGS) ×3 IMPLANT
BNDG ESMARK 4X9 LF (GAUZE/BANDAGES/DRESSINGS) ×3 IMPLANT
BNDG GAUZE ELAST 4 BULKY (GAUZE/BANDAGES/DRESSINGS) IMPLANT
BUR ROUND CARBIDE (BURR) IMPLANT
CHLORAPREP W/TINT 26ML (MISCELLANEOUS) ×3 IMPLANT
CORDS BIPOLAR (ELECTRODE) ×3 IMPLANT
COVER MAYO STAND STRL (DRAPES) ×3 IMPLANT
COVER TABLE BACK 60X90 (DRAPES) ×3 IMPLANT
CUFF TOURNIQUET SINGLE 18IN (TOURNIQUET CUFF) ×3 IMPLANT
DEPRESSOR TONGUE BLADE STERILE (MISCELLANEOUS) ×3 IMPLANT
DRAPE C-ARM 42X72 X-RAY (DRAPES) ×3 IMPLANT
DRAPE EXTREMITY TIBURON (DRAPES) ×3 IMPLANT
DRAPE SURG 17X23 STRL (DRAPES) ×3 IMPLANT
DRSG EMULSION OIL 3X3 NADH (GAUZE/BANDAGES/DRESSINGS) ×3 IMPLANT
ELECT REM PT RETURN 9FT ADLT (ELECTROSURGICAL)
ELECTRODE REM PT RTRN 9FT ADLT (ELECTROSURGICAL) IMPLANT
GAUZE SPONGE 4X4 12PLY STRL (GAUZE/BANDAGES/DRESSINGS) ×3 IMPLANT
GLOVE BIO SURGEON STRL SZ7.5 (GLOVE) ×3 IMPLANT
GLOVE BIOGEL PI IND STRL 7.0 (GLOVE) ×3 IMPLANT
GLOVE BIOGEL PI IND STRL 8 (GLOVE) ×1 IMPLANT
GLOVE BIOGEL PI INDICATOR 7.0 (GLOVE) ×6
GLOVE BIOGEL PI INDICATOR 8 (GLOVE) ×2
GLOVE ECLIPSE 6.5 STRL STRAW (GLOVE) ×9 IMPLANT
GOWN STRL REUS W/ TWL LRG LVL3 (GOWN DISPOSABLE) ×3 IMPLANT
GOWN STRL REUS W/TWL LRG LVL3 (GOWN DISPOSABLE) ×6
GOWN STRL REUS W/TWL XL LVL3 (GOWN DISPOSABLE) ×3 IMPLANT
GUIDEWIRE ORTHO 062 (WIRE) ×3 IMPLANT
LOOP VESSEL MINI RED (MISCELLANEOUS) IMPLANT
NEEDLE HYPO 22GX1.5 SAFETY (NEEDLE) ×3 IMPLANT
NS IRRIG 1000ML POUR BTL (IV SOLUTION) ×3 IMPLANT
PACK BASIN DAY SURGERY FS (CUSTOM PROCEDURE TRAY) ×3 IMPLANT
PADDING CAST ABS 4INX4YD NS (CAST SUPPLIES)
PADDING CAST ABS COTTON 4X4 ST (CAST SUPPLIES) IMPLANT
PENCIL BUTTON HOLSTER BLD 10FT (ELECTRODE) IMPLANT
SCREW FUSION ACUTRAK 30 3000 (Screw) ×1 IMPLANT
SCREW FUSION ACUTRAK 30MM 3000 (Screw) ×3 IMPLANT
STOCKINETTE 6  STRL (DRAPES) ×2
STOCKINETTE 6 STRL (DRAPES) ×1 IMPLANT
SUT STEEL 2 (SUTURE) IMPLANT
SUT STEEL 4 (SUTURE) IMPLANT
SUT STEEL 5 (SUTURE) IMPLANT
SUT VICRYL 3-0 RB1 (SUTURE) ×3 IMPLANT
SUT VICRYL RAPIDE 4-0 (SUTURE) ×3 IMPLANT
SUT VICRYL RAPIDE 4/0 PS 2 (SUTURE) IMPLANT
SYR BULB 3OZ (MISCELLANEOUS) ×3 IMPLANT
SYRINGE 10CC LL (SYRINGE) ×3 IMPLANT
TOWEL OR 17X24 6PK STRL BLUE (TOWEL DISPOSABLE) ×3 IMPLANT
TOWEL OR NON WOVEN STRL DISP B (DISPOSABLE) ×3 IMPLANT
UNDERPAD 30X30 INCONTINENT (UNDERPADS AND DIAPERS) ×3 IMPLANT

## 2014-04-27 NOTE — Interval H&P Note (Signed)
History and Physical Interval Note:  04/27/2014 10:59 AM  Lauren Thompson  has presented today for surgery, with the diagnosis of RIGHT INDEX FINGER OSTEOARTHRITIS  The various methods of treatment have been discussed with the patient and family. After consideration of risks, benefits and other options for treatment, the patient has consented to  Procedure(s): RIGHT INDEX Renick (Right) as a surgical intervention .  The patient's history has been reviewed, patient examined, no change in status, stable for surgery.  I have reviewed the patient's chart and labs.  Questions were answered to the patient's satisfaction.     Marialice Newkirk A.

## 2014-04-27 NOTE — Anesthesia Postprocedure Evaluation (Signed)
  Anesthesia Post-op Note  Patient: Lauren Thompson  Procedure(s) Performed: Procedure(s): RIGHT INDEX FINGER DISTAL INTERPHALANGEAL JOINT FUSION (Right)  Patient Location: PACU  Anesthesia Type:General  Level of Consciousness: awake and alert   Airway and Oxygen Therapy: Patient Spontanous Breathing  Post-op Pain: none  Post-op Assessment: Post-op Vital signs reviewed, Patient's Cardiovascular Status Stable and Respiratory Function Stable  Post-op Vital Signs: Reviewed  Filed Vitals:   04/27/14 1300  BP: 110/54  Pulse: 73  Temp:   Resp: 17    Complications: No apparent anesthesia complications

## 2014-04-27 NOTE — Discharge Instructions (Signed)
Discharge Instructions   You have a dressing with a splint incorporated in it. Move your fingers as much as possible, making a full fist and fully opening the fist. Elevate your hand to reduce pain & swelling of the digits.  Ice over the operative site may be helpful to reduce pain & swelling.  DO NOT USE HEAT. Pain medicine has been prescribed for you.  Use your medicine as needed over the first 48 hours, and then you can begin to taper your use.  You may use Tylenol in place of your prescribed pain medication, but not IN ADDITION to it. Leave the dressing in place until you return to our office.  You may shower, but keep the bandage clean & dry.  You may drive a car when you are off of prescription pain medications and can safely control your vehicle with both hands. Our office will call you to arrange follow-up   Please call [phone] during normal business hours or [phone] after hours for any problems. Including the following:  - excessive redness of the incisions - drainage for more than 4 days - fever of more than 101.5 F  *Please note that pain medications will not be refilled after hours or on weekends.   Post Anesthesia Home Care Instructions  Activity: Get plenty of rest for the remainder of the day. A responsible adult should stay with you for 24 hours following the procedure.  For the next 24 hours, DO NOT: -Drive a car -Paediatric nurse -Drink alcoholic beverages -Take any medication unless instructed by your physician -Make any legal decisions or sign important papers.  Meals: Start with liquid foods such as gelatin or soup. Progress to regular foods as tolerated. Avoid greasy, spicy, heavy foods. If nausea and/or vomiting occur, drink only clear liquids until the nausea and/or vomiting subsides. Call your physician if vomiting continues.  Special Instructions/Symptoms: Your throat may feel dry or sore from the anesthesia or the breathing tube placed in  your throat during surgery. If this causes discomfort, gargle with warm salt water. The discomfort should disappear within 24 hours.

## 2014-04-27 NOTE — Anesthesia Preprocedure Evaluation (Addendum)
Anesthesia Evaluation  Patient identified by MRN, date of birth, ID band Patient awake    Reviewed: Allergy & Precautions, H&P , NPO status , Patient's Chart, lab work & pertinent test results  History of Anesthesia Complications (+) PONV  Airway Mallampati: II TM Distance: >3 FB Neck ROM: Full    Dental no notable dental hx. (+) Teeth Intact, Dental Advisory Given   Pulmonary neg pulmonary ROS,  breath sounds clear to auscultation  Pulmonary exam normal       Cardiovascular negative cardio ROS  Rhythm:Regular Rate:Normal     Neuro/Psych negative neurological ROS  negative psych ROS   GI/Hepatic negative GI ROS, Neg liver ROS,   Endo/Other  negative endocrine ROS  Renal/GU negative Renal ROS  negative genitourinary   Musculoskeletal   Abdominal   Peds  Hematology negative hematology ROS (+)   Anesthesia Other Findings   Reproductive/Obstetrics negative OB ROS                          Anesthesia Physical Anesthesia Plan  ASA: II  Anesthesia Plan: General   Post-op Pain Management:    Induction: Intravenous  Airway Management Planned: LMA  Additional Equipment:   Intra-op Plan:   Post-operative Plan: Extubation in OR  Informed Consent: I have reviewed the patients History and Physical, chart, labs and discussed the procedure including the risks, benefits and alternatives for the proposed anesthesia with the patient or authorized representative who has indicated his/her understanding and acceptance.   Dental advisory given  Plan Discussed with: CRNA  Anesthesia Plan Comments:         Anesthesia Quick Evaluation

## 2014-04-27 NOTE — Anesthesia Procedure Notes (Signed)
Procedure Name: LMA Insertion Date/Time: 04/27/2014 11:07 AM Performed by: Toula Moos L Pre-anesthesia Checklist: Patient identified, Emergency Drugs available, Suction available, Patient being monitored and Timeout performed Patient Re-evaluated:Patient Re-evaluated prior to inductionOxygen Delivery Method: Circle System Utilized Preoxygenation: Pre-oxygenation with 100% oxygen Intubation Type: IV induction Ventilation: Mask ventilation without difficulty LMA: LMA inserted LMA Size: 4.0 Number of attempts: 1 Airway Equipment and Method: bite block Placement Confirmation: positive ETCO2 and breath sounds checked- equal and bilateral Tube secured with: Tape Dental Injury: Teeth and Oropharynx as per pre-operative assessment

## 2014-04-27 NOTE — Transfer of Care (Signed)
Immediate Anesthesia Transfer of Care Note  Patient: Lauren Thompson  Procedure(s) Performed: Procedure(s): RIGHT INDEX FINGER DISTAL INTERPHALANGEAL JOINT FUSION (Right)  Patient Location: PACU  Anesthesia Type:General  Level of Consciousness: awake and patient cooperative  Airway & Oxygen Therapy: Patient Spontanous Breathing and Patient connected to face mask oxygen  Post-op Assessment: Report given to PACU RN and Post -op Vital signs reviewed and stable  Post vital signs: Reviewed and stable  Complications: No apparent anesthesia complications

## 2014-04-27 NOTE — Op Note (Addendum)
04/27/2014  11:00 AM  PATIENT:  Lauren Thompson  68 y.o. female  PRE-OPERATIVE DIAGNOSIS:  Right index finger DIP osteoarthritis  POST-OPERATIVE DIAGNOSIS:  Same  PROCEDURE:  Right index finger PIP arthrodesis  SURGEON: Rayvon Char. Grandville Silos, MD  PHYSICIAN ASSISTANT: Morley Kos, OPA-C  ANESTHESIA:  general  SPECIMENS:  None  DRAINS:   None  PREOPERATIVE INDICATIONS:  Lauren Thompson is a  68 y.o. female with marked symptomatic right index finger DIP osteoarthritis  The risks benefits and alternatives were discussed with the patient preoperatively including but not limited to the risks of infection, bleeding, nerve injury, cardiopulmonary complications, the need for revision surgery, among others, and the patient verbalized understanding and consented to proceed.  OPERATIVE IMPLANTS: Acutrak fusion screws, 30 length  OPERATIVE PROCEDURE:  After receiving prophylactic antibiotics, the patient was escorted to the operative theatre and placed in a supine position.  General anesthesia was administered. A surgical "time-out" was performed during which the planned procedure, proposed operative site, and the correct patient identity were compared to the operative consent and agreement confirmed by the circulating nurse according to current facility policy.  Following application of a tourniquet to the operative extremity, the exposed skin was prepped with Chloraprep and draped in the usual sterile fashion.  The limb was exsanguinated with an Esmarch bandage and the tourniquet inflated to approximately 145mmHg higher than systolic BP.  Ten mLs of half percent Marcaine with epinephrine was instilled into the base of the digit to help provide for hemostasis and postoperative pain control.  An H-shaped incision was marked over the dorsal aspect of the DIP joint. Skin was incised sharply with scalpel, flaps were retracted proximally and distally with the proximal flap secured with retention  sutures. Care was taken to protect and preserve the germinal matrix. Extensor tendon was split proximal to the level of the DIP joint. The collaterals were released off the head of the middle phalanx and the joint was shotgun. There was extensive eburnation and destructive changes. Both sides were debrided with curettes and rhonchorous down to good bone for fusion with care to try to reverse the radial angulation is present. There were massive osteophytes on the dorsum of the middle phalanx. The base of the distal phalanx and also widened quite a bit radially to ulnarly and this was excised. Once satisfied with the improve shape of the bones a 0.062 inch K wire was driven common distal phalanx from inside out. This was done fluoroscopic assistance. K wire was then removed and the distal phalanx and used to create a path middle phalanx. The K wire was then placed back into the distal phalanx and the 2 were repositioned. Screw length was measured. All hardware was removed and the joint was copiously irrigated. An Acutrak DIP fusion screw, length 30, was inserted from the tip of the digit, dancing and towards the PIP joint. The fusion site was held securely together in the proper rotation as a screw was placed. It was advanced deep enough so that the trailing end was buried within the distal phalanx. Final images were obtained. The wound was again irrigated and the extensor tendon secured to the remnants of soft tissue at the base of the distal phalanx and also the collaterals to allow for them not to retract and greater propensity for swan-neck deformity. The skin was then reapproximated with 4-0 Vicryl Rapide interrupted sutures and a digital dressing with a dorsal tongue blade was applied. She was awakened and taken to room  stable condition, breathing spontaneously   DISPOSITION: She'll be discharged home today with typical instructions, returning in 7-10 days for reevaluation, including new x-rays of the right  index finger out of splint.  There will be a follow-on appointment with hand therapy to make a protective fingertip splint.Marland Kitchen

## 2014-04-28 ENCOUNTER — Encounter (HOSPITAL_BASED_OUTPATIENT_CLINIC_OR_DEPARTMENT_OTHER): Payer: Self-pay | Admitting: Orthopedic Surgery

## 2014-05-04 DIAGNOSIS — M19041 Primary osteoarthritis, right hand: Secondary | ICD-10-CM | POA: Diagnosis not present

## 2014-05-04 DIAGNOSIS — M19042 Primary osteoarthritis, left hand: Secondary | ICD-10-CM | POA: Diagnosis not present

## 2014-05-25 ENCOUNTER — Encounter (HOSPITAL_BASED_OUTPATIENT_CLINIC_OR_DEPARTMENT_OTHER): Payer: Self-pay | Admitting: Orthopedic Surgery

## 2014-06-02 DIAGNOSIS — L814 Other melanin hyperpigmentation: Secondary | ICD-10-CM | POA: Diagnosis not present

## 2014-06-02 DIAGNOSIS — B351 Tinea unguium: Secondary | ICD-10-CM | POA: Diagnosis not present

## 2014-06-02 DIAGNOSIS — L821 Other seborrheic keratosis: Secondary | ICD-10-CM | POA: Diagnosis not present

## 2014-06-02 DIAGNOSIS — B079 Viral wart, unspecified: Secondary | ICD-10-CM | POA: Diagnosis not present

## 2014-06-03 DIAGNOSIS — M8580 Other specified disorders of bone density and structure, unspecified site: Secondary | ICD-10-CM | POA: Diagnosis not present

## 2014-06-03 DIAGNOSIS — R8299 Other abnormal findings in urine: Secondary | ICD-10-CM | POA: Diagnosis not present

## 2014-06-03 DIAGNOSIS — Z0000 Encounter for general adult medical examination without abnormal findings: Secondary | ICD-10-CM | POA: Diagnosis not present

## 2014-06-03 DIAGNOSIS — M859 Disorder of bone density and structure, unspecified: Secondary | ICD-10-CM | POA: Diagnosis not present

## 2014-06-10 DIAGNOSIS — M19041 Primary osteoarthritis, right hand: Secondary | ICD-10-CM | POA: Diagnosis not present

## 2014-06-11 ENCOUNTER — Other Ambulatory Visit (HOSPITAL_COMMUNITY): Payer: Self-pay | Admitting: Internal Medicine

## 2014-06-11 DIAGNOSIS — R1904 Left lower quadrant abdominal swelling, mass and lump: Secondary | ICD-10-CM

## 2014-06-11 DIAGNOSIS — M8580 Other specified disorders of bone density and structure, unspecified site: Secondary | ICD-10-CM | POA: Diagnosis not present

## 2014-06-11 DIAGNOSIS — M542 Cervicalgia: Secondary | ICD-10-CM | POA: Diagnosis not present

## 2014-06-11 DIAGNOSIS — K5900 Constipation, unspecified: Secondary | ICD-10-CM | POA: Diagnosis not present

## 2014-06-11 DIAGNOSIS — Z6820 Body mass index (BMI) 20.0-20.9, adult: Secondary | ICD-10-CM | POA: Diagnosis not present

## 2014-06-11 DIAGNOSIS — Z1389 Encounter for screening for other disorder: Secondary | ICD-10-CM | POA: Diagnosis not present

## 2014-06-11 DIAGNOSIS — Z0000 Encounter for general adult medical examination without abnormal findings: Secondary | ICD-10-CM | POA: Diagnosis not present

## 2014-06-12 ENCOUNTER — Ambulatory Visit (HOSPITAL_COMMUNITY)
Admission: RE | Admit: 2014-06-12 | Discharge: 2014-06-12 | Disposition: A | Discharge status: Home / Self Care | Payer: Medicare Other | Source: Ambulatory Visit | Attending: Internal Medicine | Admitting: Internal Medicine

## 2014-06-12 DIAGNOSIS — K7689 Other specified diseases of liver: Secondary | ICD-10-CM | POA: Diagnosis not present

## 2014-06-12 DIAGNOSIS — R1904 Left lower quadrant abdominal swelling, mass and lump: Secondary | ICD-10-CM | POA: Insufficient documentation

## 2014-06-12 DIAGNOSIS — R1032 Left lower quadrant pain: Secondary | ICD-10-CM | POA: Diagnosis not present

## 2014-06-12 DIAGNOSIS — Z9049 Acquired absence of other specified parts of digestive tract: Secondary | ICD-10-CM | POA: Diagnosis not present

## 2014-06-19 DIAGNOSIS — Z1212 Encounter for screening for malignant neoplasm of rectum: Secondary | ICD-10-CM | POA: Diagnosis not present

## 2014-07-13 DIAGNOSIS — M19041 Primary osteoarthritis, right hand: Secondary | ICD-10-CM | POA: Diagnosis not present

## 2014-09-01 ENCOUNTER — Telehealth: Payer: Self-pay | Admitting: Nurse Practitioner

## 2014-09-01 NOTE — Telephone Encounter (Signed)
LMTCB about cx appointment with PG

## 2014-09-14 DIAGNOSIS — M19041 Primary osteoarthritis, right hand: Secondary | ICD-10-CM | POA: Diagnosis not present

## 2014-09-16 DIAGNOSIS — Z1231 Encounter for screening mammogram for malignant neoplasm of breast: Secondary | ICD-10-CM | POA: Diagnosis not present

## 2014-09-21 DIAGNOSIS — R928 Other abnormal and inconclusive findings on diagnostic imaging of breast: Secondary | ICD-10-CM | POA: Diagnosis not present

## 2014-09-21 DIAGNOSIS — R922 Inconclusive mammogram: Secondary | ICD-10-CM | POA: Diagnosis not present

## 2014-09-22 DIAGNOSIS — H9313 Tinnitus, bilateral: Secondary | ICD-10-CM | POA: Diagnosis not present

## 2014-10-09 ENCOUNTER — Ambulatory Visit: Payer: Medicare Other | Admitting: Nurse Practitioner

## 2014-11-05 ENCOUNTER — Ambulatory Visit: Payer: Medicare Other | Admitting: Nurse Practitioner

## 2014-11-19 ENCOUNTER — Encounter: Payer: Self-pay | Admitting: Nurse Practitioner

## 2014-11-19 ENCOUNTER — Ambulatory Visit (INDEPENDENT_AMBULATORY_CARE_PROVIDER_SITE_OTHER): Payer: Medicare Other | Admitting: Nurse Practitioner

## 2014-11-19 VITALS — BP 120/72 | HR 64 | Ht 65.0 in | Wt 128.0 lb

## 2014-11-19 DIAGNOSIS — Z01419 Encounter for gynecological examination (general) (routine) without abnormal findings: Secondary | ICD-10-CM | POA: Diagnosis not present

## 2014-11-19 NOTE — Progress Notes (Signed)
Patient ID: Lauren Thompson, female   DOB: [birth year], 69 y.o.   MRN: [id number] 69 y.o. G68P2002 Married  Caucasian Fe here for annual exam.  Only new problem was colon polyps.  Has frequent constipaton.  Patient's last menstrual period was 07/24/2000.          Sexually active: Yes.    The current method of family planning is post menopausal status.    Exercising: Yes.    treadmill Smoker:  no  Health Maintenance: Pap:  09/17/12, negative with neg HR HPV MMG:  09/16/14, 3D, Bi-Rads 2:  Benign Colonoscopy:  09/2013, 12 polyps, repeat in 5 years BMD:   09/26/11, -1.9 S/-1.1 R/-1.2 L TDaP:  2010 Shingles: 2015 Prevnar 13: 2015 Labs:  HB:  PCP  Urine:  Declined    reports that she has never smoked. She has never used smokeless tobacco. She reports that she drinks alcohol. She reports that she does not use illicit drugs.  Past Medical History  Diagnosis Date  . Atrophic vaginitis   . Osteopenia   . MVP (mitral valve prolapse)     Antibiotics are NOT require for procedures  . Idiopathic edema     fluid retention  . PONV (postoperative nausea and vomiting)   . Arthritis     rt index finger    Past Surgical History  Procedure Laterality Date  . Tonsillectomy and adenoidectomy  childhood  . Cholecystectomy, laparoscopic  1991  . Shoulder surgery Right 12/01/1999  . Tonsillectomy    . Cholecystectomy    . Distal interphalangeal joint fusion Right 04/27/2014    Procedure: RIGHT INDEX FINGER DISTAL INTERPHALANGEAL JOINT FUSION;  Surgeon: Jolyn Nap, MD;  Location: [facility];  Service: Orthopedics;  Laterality: Right;    Current Outpatient Prescriptions  Medication Sig Dispense Refill  . aspirin 81 MG tablet Take 81 mg by mouth daily.    Marland Kitchen triamterene-hydrochlorothiazide (DYAZIDE) 37.5-25 MG per capsule Take 1 capsule by mouth daily as needed (swelling).    . Vitamin D, Ergocalciferol, (DRISDOL) 50000 UNITS CAPS capsule TAKE 1 CAPSULE BY MOUTH ONCE WEEKLY 30 capsule 3    No current facility-administered medications for this visit.    Family History  Problem Relation Age of Onset  . Diabetes Mother   . Hypertension Mother   . Stroke Mother   . Heart disease Father   . Colon cancer Father   . Alzheimer's disease Father   . Stroke Maternal Grandmother   . Diabetes Paternal Grandmother     ROS:  Pertinent items are noted in HPI.  Otherwise, a comprehensive ROS was negative.  Exam:   BP 120/72 mmHg  Pulse 64  Ht 5\' 5"  (1.651 m)  Wt 128 lb (58.06 kg)  BMI 21.30 kg/m2  LMP 07/24/2000 Height: 5\' 5"  (165.1 cm) Ht Readings from Last 3 Encounters:  11/19/14 5\' 5"  (1.651 m)  04/27/14 5' 5.5" (1.664 m)  10/06/13 5\' 5"  (1.651 m)    General appearance: alert, cooperative and appears stated age Head: Normocephalic, without obvious abnormality, atraumatic Neck: no adenopathy, supple, symmetrical, trachea midline and thyroid normal to inspection and palpation Lungs: clear to auscultation bilaterally Breasts: normal appearance, no masses or tenderness Heart: regular rate and rhythm Abdomen: soft, non-tender; no masses,  no organomegaly Extremities: extremities normal, atraumatic, no cyanosis or edema Skin: Skin color, texture, turgor normal. No rashes or lesions Lymph nodes: Cervical, supraclavicular, and axillary nodes normal. No abnormal inguinal nodes palpated Neurologic: Grossly normal   Pelvic:  External genitalia:  no lesions              Urethra:  normal appearing urethra with no masses, tenderness or lesions              Bartholin's and Skene's: normal                 Vagina: normal appearing vagina with normal color and discharge, no lesions              Cervix: anteverted              Pap taken: No. Bimanual Exam:  Uterus:  normal size, contour, position, consistency, mobility, non-tender              Adnexa: no mass, fullness, tenderness               Rectovaginal: Confirms               Anus:  normal sphincter tone, no  lesions  Chaperone present:  no  A:  Well Woman with normal exam  Postmenopausal no HRT Atrophic vaginitis declines treatment Vit D deficiency PCP manages - now RX dose at every 14 days  P:   Reviewed health and wellness pertinent to exam  Pap smear not taken today  Mammogram is due 08/2015  Counseled on breast self exam, mammography screening, adequate intake of calcium and vitamin D, diet and exercise, Kegel's exercises return annually or prn  An After Visit Summary was printed and given to the patient.

## 2014-11-19 NOTE — Patient Instructions (Signed)

## 2014-11-20 NOTE — Progress Notes (Signed)
Encounter reviewed by Dr. Brook Silva.  

## 2015-03-30 DIAGNOSIS — L821 Other seborrheic keratosis: Secondary | ICD-10-CM | POA: Diagnosis not present

## 2015-03-30 DIAGNOSIS — B079 Viral wart, unspecified: Secondary | ICD-10-CM | POA: Diagnosis not present

## 2015-03-30 DIAGNOSIS — Z419 Encounter for procedure for purposes other than remedying health state, unspecified: Secondary | ICD-10-CM | POA: Diagnosis not present

## 2015-05-01 DIAGNOSIS — Z23 Encounter for immunization: Secondary | ICD-10-CM | POA: Diagnosis not present

## 2015-06-09 DIAGNOSIS — M859 Disorder of bone density and structure, unspecified: Secondary | ICD-10-CM | POA: Diagnosis not present

## 2015-06-16 DIAGNOSIS — Z6820 Body mass index (BMI) 20.0-20.9, adult: Secondary | ICD-10-CM | POA: Diagnosis not present

## 2015-06-16 DIAGNOSIS — Z0000 Encounter for general adult medical examination without abnormal findings: Secondary | ICD-10-CM | POA: Diagnosis not present

## 2015-06-16 DIAGNOSIS — F5104 Psychophysiologic insomnia: Secondary | ICD-10-CM | POA: Diagnosis not present

## 2015-06-16 DIAGNOSIS — L659 Nonscarring hair loss, unspecified: Secondary | ICD-10-CM | POA: Diagnosis not present

## 2015-06-16 DIAGNOSIS — M859 Disorder of bone density and structure, unspecified: Secondary | ICD-10-CM | POA: Diagnosis not present

## 2015-06-16 DIAGNOSIS — K5900 Constipation, unspecified: Secondary | ICD-10-CM | POA: Diagnosis not present

## 2015-06-16 DIAGNOSIS — Z1389 Encounter for screening for other disorder: Secondary | ICD-10-CM | POA: Diagnosis not present

## 2015-06-21 DIAGNOSIS — Z1212 Encounter for screening for malignant neoplasm of rectum: Secondary | ICD-10-CM | POA: Diagnosis not present

## 2015-08-05 IMAGING — US US ABDOMEN COMPLETE
1 series · 14 of 25 positions shown · non-contrast
Comparison: None.

CLINICAL DATA: Left lower quadrant abdominal mass. History of a
cholecystectomy.

EXAM:
ULTRASOUND ABDOMEN COMPLETE

[Series 1: us abdomen complete · 0.14mm/px · 14 of 58 slices shown]
[im 1/58]
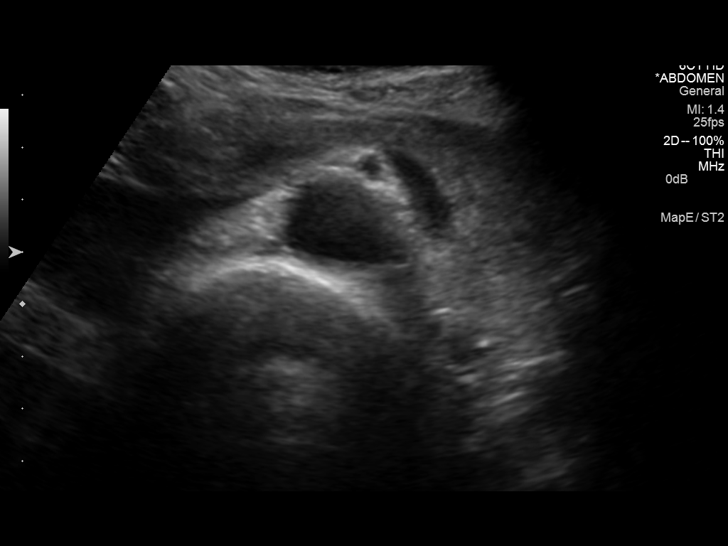
[im 5/58]
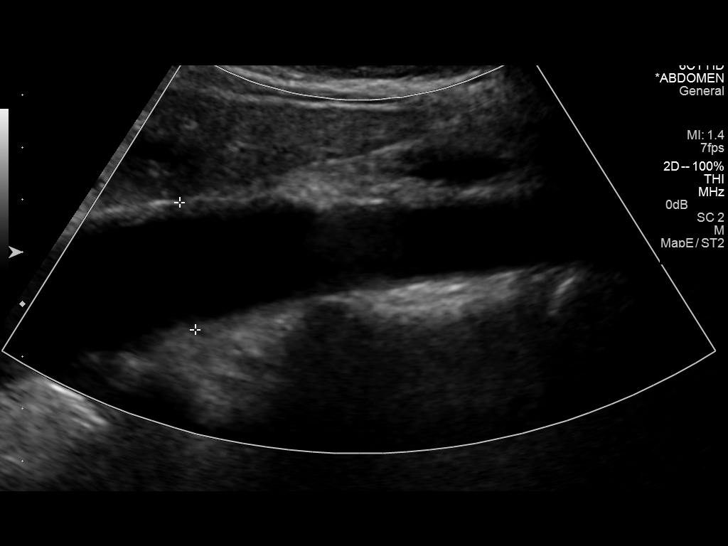
[im 10/58]
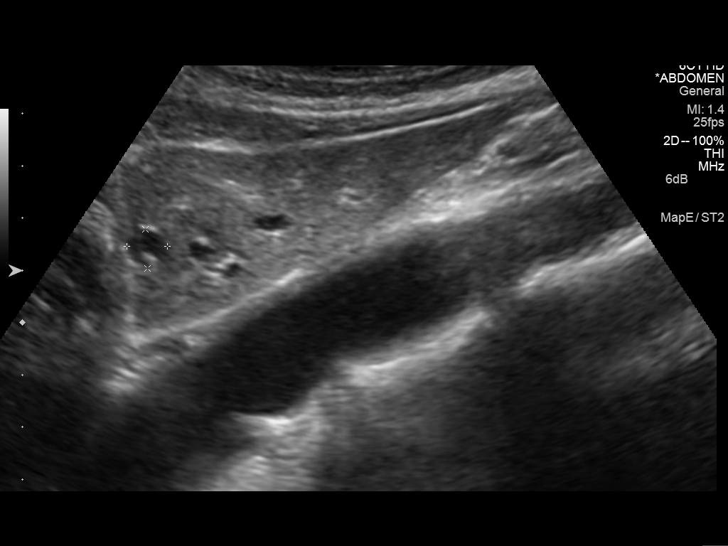
[im 15/58]
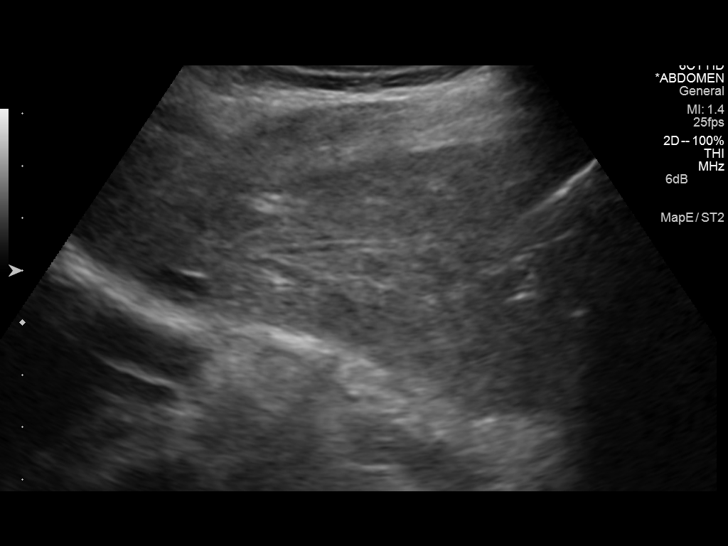
[im 20/58]
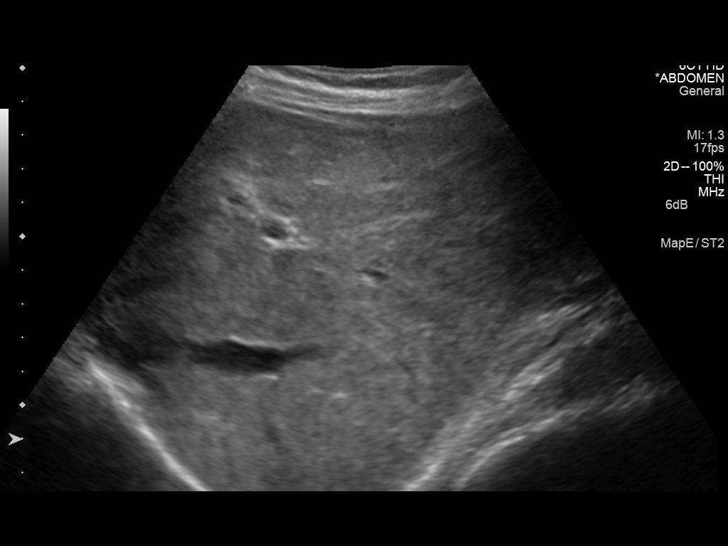
[im 22/58]
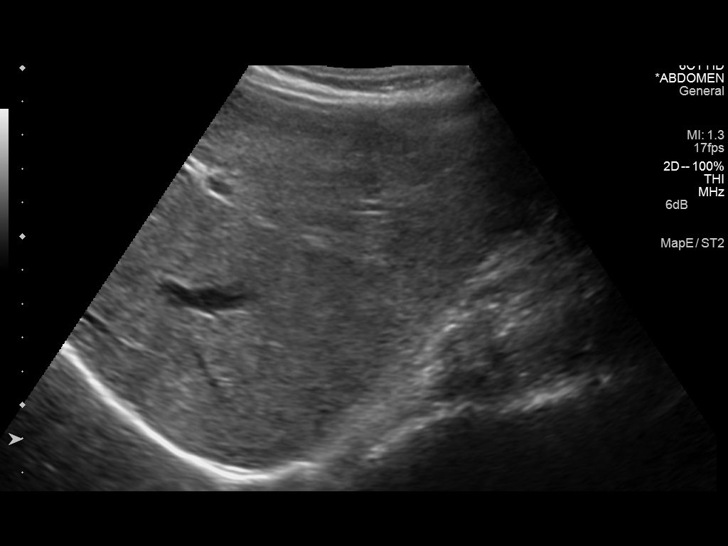
[im 27/58]
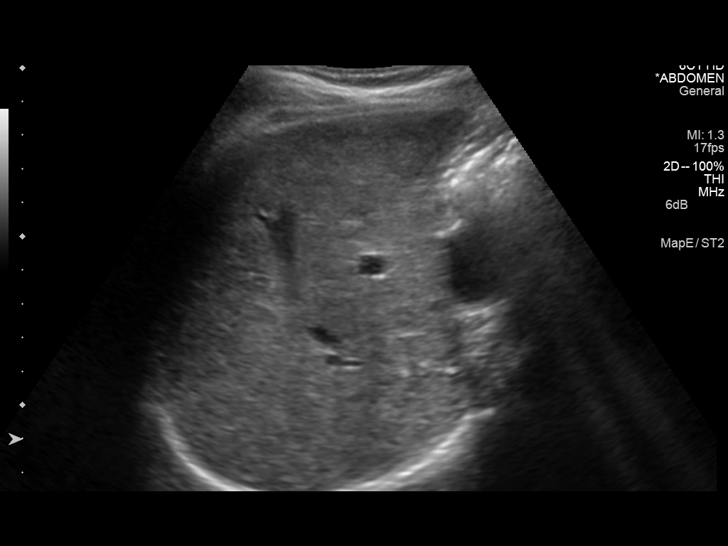
[im 31/58]
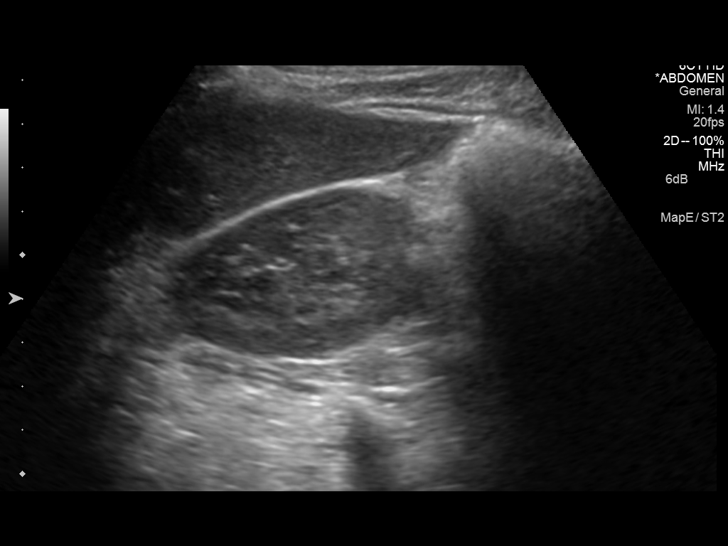
[im 36/58]
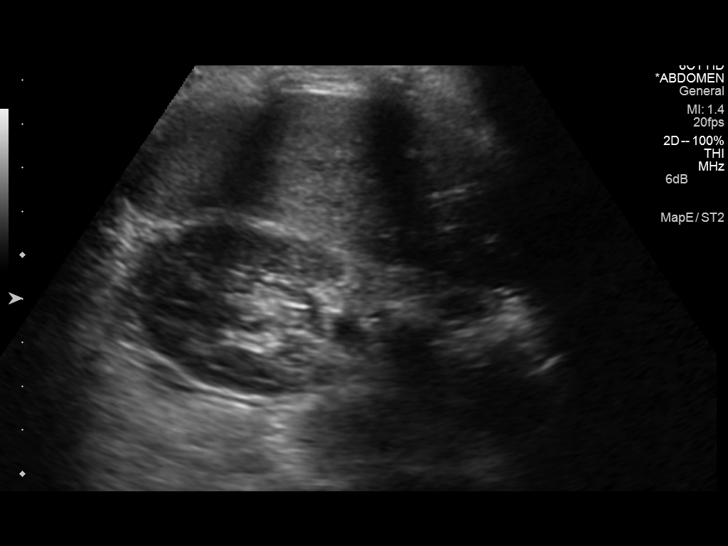
[im 39/58]
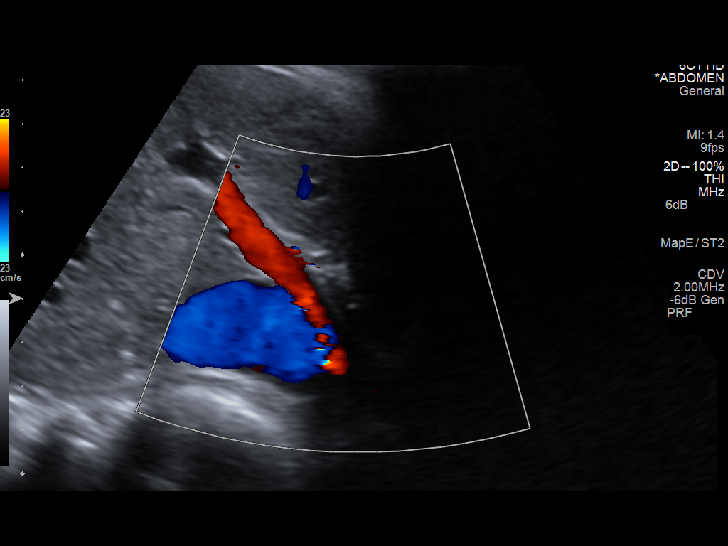
[im 43/58]
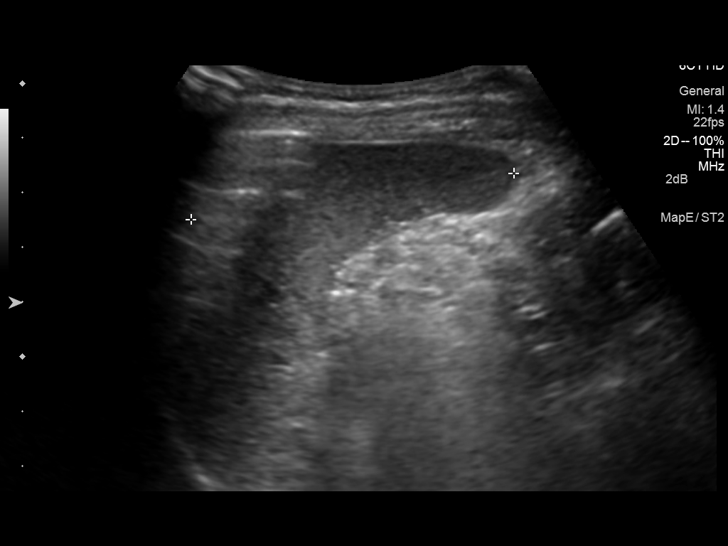
[im 48/58]
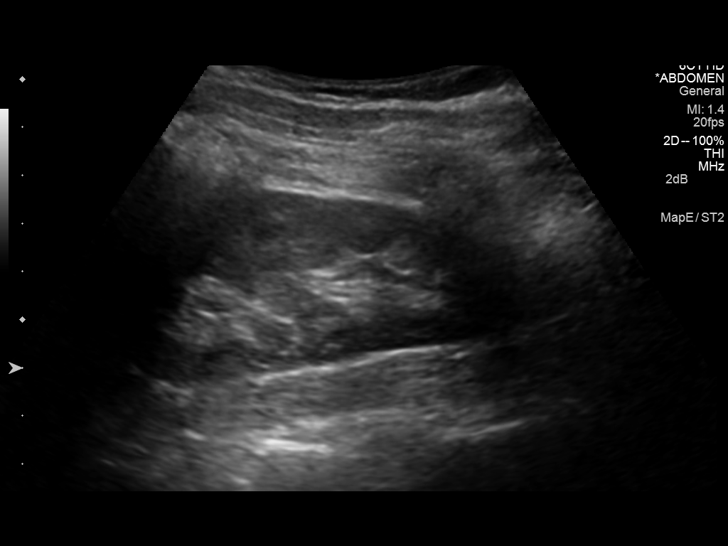
[im 53/58]
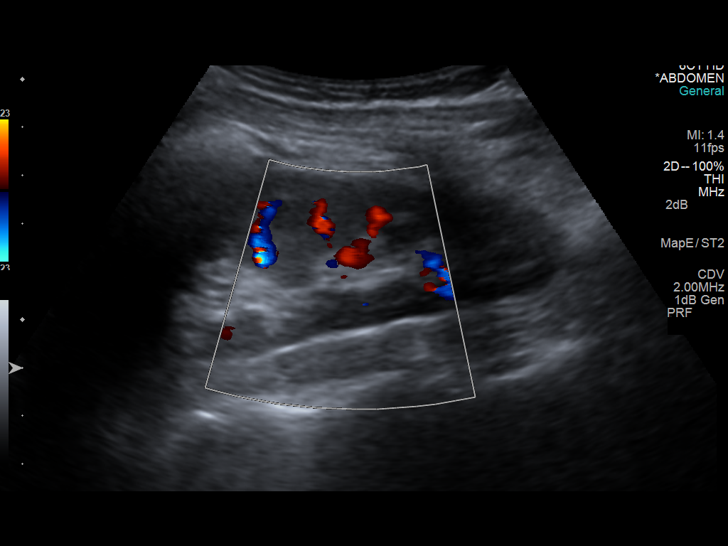
[im 58/58]
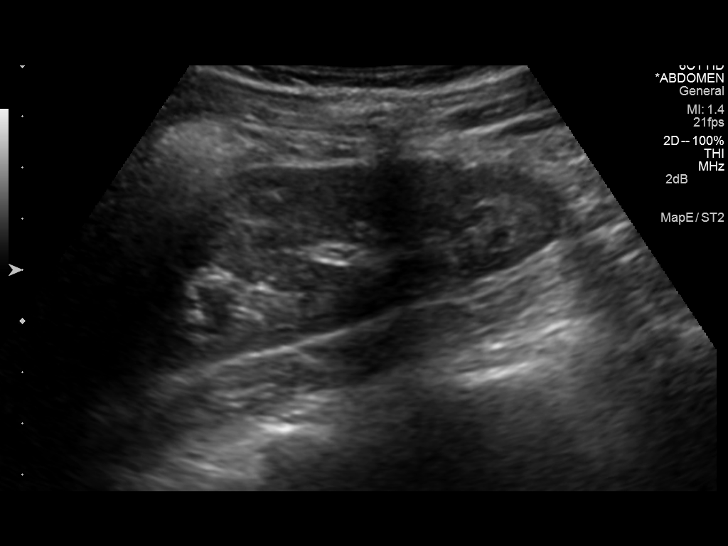

[14 of 25 positions shown; findings below may reference images not displayed]

FINDINGS: Gallbladder: Surgically absent

Common bile duct: Diameter: 5.3 mm

Liver: Small left lobe cyst measuring 12 mm. Liver otherwise
unremarkable.

IVC: No abnormality visualized.

Pancreas: Visualized portion unremarkable.

Spleen: Size and appearance within normal limits.

Right Kidney: Length: 8.5 cm. Echogenicity within normal limits. No
mass or hydronephrosis visualized.

Left Kidney: Length: 9.1 cm. Echogenicity within normal limits. No
mass or hydronephrosis visualized.

Abdominal aorta: No aneurysm visualized.  Aorta normal in caliber.

Other findings: None.
IMPRESSION: 1. No acute findings. No abdominal mass seen. Normal caliber
abdominal aorta.
2. Status post cholecystectomy. Small liver cyst. No other
abnormalities.

## 2015-10-22 DIAGNOSIS — Z1231 Encounter for screening mammogram for malignant neoplasm of breast: Secondary | ICD-10-CM | POA: Diagnosis not present

## 2015-11-10 DIAGNOSIS — L821 Other seborrheic keratosis: Secondary | ICD-10-CM | POA: Diagnosis not present

## 2015-11-10 DIAGNOSIS — L570 Actinic keratosis: Secondary | ICD-10-CM | POA: Diagnosis not present

## 2015-11-10 DIAGNOSIS — B078 Other viral warts: Secondary | ICD-10-CM | POA: Diagnosis not present

## 2015-11-22 ENCOUNTER — Ambulatory Visit (INDEPENDENT_AMBULATORY_CARE_PROVIDER_SITE_OTHER): Payer: Medicare Other | Admitting: Nurse Practitioner

## 2015-11-22 ENCOUNTER — Encounter: Payer: Self-pay | Admitting: Nurse Practitioner

## 2015-11-22 VITALS — BP 120/84 | HR 68 | Ht 65.0 in | Wt 125.0 lb

## 2015-11-22 DIAGNOSIS — Z124 Encounter for screening for malignant neoplasm of cervix: Secondary | ICD-10-CM

## 2015-11-22 DIAGNOSIS — Z0000 Encounter for general adult medical examination without abnormal findings: Secondary | ICD-10-CM

## 2015-11-22 DIAGNOSIS — Z01419 Encounter for gynecological examination (general) (routine) without abnormal findings: Secondary | ICD-10-CM

## 2015-11-22 NOTE — Patient Instructions (Addendum)

## 2015-11-22 NOTE — Progress Notes (Signed)
Patient ID: Lauren Thompson, female   DOB: [birth year], 70 y.o.   MRN: [id number]  70 y.o. G64P2002 Married  Caucasian Fe here for annual exam.  No new health problems.  Patient's last menstrual period was 07/24/2000.          Sexually active: Yes.    The current method of family planning is post menopausal status.    Exercising: Yes.    treadmill Smoker:  no  Health Maintenance: Pap: 06/08/10, WNL MMG: 10/22/15, 3D, Bi-Rads 1: Negative Colonoscopy: 12/2007, repeat in 5 years BMD: 09/26/11 T Score, -1.9 Spine / -1.1 Right Femur Neck / -1.2 Left Femur Neck (GGA) TDaP: 2010 Shingles: within last 5 years Pneumonia: 2015 Hep C :discuss today and will get at PCP Labs: PCP takes care of all labs   reports that she has never smoked. She has never used smokeless tobacco. She reports that she drinks alcohol. She reports that she does not use illicit drugs.  Past Medical History  Diagnosis Date  . Atrophic vaginitis   . Osteopenia   . MVP (mitral valve prolapse)     Antibiotics are NOT require for procedures  . Idiopathic edema     fluid retention  . PONV (postoperative nausea and vomiting)   . Arthritis     rt index finger    Past Surgical History  Procedure Laterality Date  . Tonsillectomy and adenoidectomy  childhood  . Cholecystectomy, laparoscopic  1991  . Shoulder surgery Right 12/01/1999  . Tonsillectomy    . Cholecystectomy    . Distal interphalangeal joint fusion Right 04/27/2014    Procedure: RIGHT INDEX FINGER DISTAL INTERPHALANGEAL JOINT FUSION;  Surgeon: Jolyn Nap, MD;  Location: [facility];  Service: Orthopedics;  Laterality: Right;    Current Outpatient Prescriptions  Medication Sig Dispense Refill  . aspirin 81 MG tablet Take 81 mg by mouth daily.    Marland Kitchen triamterene-hydrochlorothiazide (DYAZIDE) 37.5-25 MG per capsule Take 1 capsule by mouth daily as needed (swelling).    . Vitamin D, Ergocalciferol, (DRISDOL) 50000 UNITS CAPS capsule TAKE 1 CAPSULE  BY MOUTH ONCE WEEKLY (Patient taking differently: Take 50,000 Units by mouth every 7 (seven) days. VITAMIN D) 30 capsule 3   No current facility-administered medications for this visit.    Family History  Problem Relation Age of Onset  . Diabetes Mother   . Hypertension Mother   . Stroke Mother   . Heart disease Father   . Colon cancer Father   . Alzheimer's disease Father   . Stroke Maternal Grandmother   . Diabetes Paternal Grandmother     ROS:  Pertinent items are noted in HPI.  Otherwise, a comprehensive ROS was negative.  Exam:   BP 120/84 mmHg  Pulse 68  Ht 5\' 5"  (1.651 m)  Wt 125 lb (56.7 kg)  BMI 20.80 kg/m2  LMP 07/24/2000 Height: 5\' 5"  (165.1 cm) Ht Readings from Last 3 Encounters:  11/22/15 5\' 5"  (1.651 m)  11/19/14 5\' 5"  (1.651 m)  04/27/14 5' 5.5" (1.664 m)    General appearance: alert, cooperative and appears stated age Head: Normocephalic, without obvious abnormality, atraumatic Neck: no adenopathy, supple, symmetrical, trachea midline and thyroid normal to inspection and palpation Lungs: clear to auscultation bilaterally Breasts: normal appearance, no masses or tenderness Heart: regular rate and rhythm Abdomen: soft, non-tender; no masses,  no organomegaly Extremities: extremities normal, atraumatic, no cyanosis or edema Skin: Skin color, texture, turgor normal. No rashes or lesions Lymph nodes: Cervical, supraclavicular,  and axillary nodes normal. No abnormal inguinal nodes palpated Neurologic: Grossly normal   Pelvic: External genitalia:  no lesions              Urethra:  normal appearing urethra with no masses, tenderness or lesions              Bartholin's and Skene's: normal                 Vagina: normal appearing vagina with normal color and discharge, no lesions              Cervix: anteverted              Pap taken: Yes.   Bimanual Exam:  Uterus:  normal size, contour, position, consistency, mobility, non-tender              Adnexa: no  mass, fullness, tenderness               Rectovaginal: Confirms               Anus:  normal sphincter tone, no lesions  Chaperone present: yes  A:  Well Woman with normal exam  Postmenopausal no HRT Atrophic vaginitis declines treatment Vit D deficiency PCP manages - now RX dose at every 7 days    P:   Reviewed health and wellness pertinent to exam  Pap smear as above  Mammogram is due 09/2016  Follow with pap  Counseled on breast self exam, mammography screening, adequate intake of calcium and vitamin D, diet and exercise, Kegel's exercises return annually or prn  An After Visit Summary was printed and given to the patient.

## 2015-11-23 LAB — IPS PAP SMEAR ONLY

## 2015-11-28 NOTE — Progress Notes (Signed)
Encounter reviewed by Dr. Zian Mohamed Amundson C. Silva.  

## 2016-04-22 DIAGNOSIS — Z23 Encounter for immunization: Secondary | ICD-10-CM | POA: Diagnosis not present

## 2016-06-14 DIAGNOSIS — M859 Disorder of bone density and structure, unspecified: Secondary | ICD-10-CM | POA: Diagnosis not present

## 2016-06-14 DIAGNOSIS — Z0000 Encounter for general adult medical examination without abnormal findings: Secondary | ICD-10-CM | POA: Diagnosis not present

## 2016-06-19 DIAGNOSIS — Z0000 Encounter for general adult medical examination without abnormal findings: Secondary | ICD-10-CM | POA: Diagnosis not present

## 2016-06-19 DIAGNOSIS — Z23 Encounter for immunization: Secondary | ICD-10-CM | POA: Diagnosis not present

## 2016-06-19 DIAGNOSIS — Z6821 Body mass index (BMI) 21.0-21.9, adult: Secondary | ICD-10-CM | POA: Diagnosis not present

## 2016-06-19 DIAGNOSIS — M859 Disorder of bone density and structure, unspecified: Secondary | ICD-10-CM | POA: Diagnosis not present

## 2016-06-19 DIAGNOSIS — Z1389 Encounter for screening for other disorder: Secondary | ICD-10-CM | POA: Diagnosis not present

## 2016-06-19 DIAGNOSIS — M542 Cervicalgia: Secondary | ICD-10-CM | POA: Diagnosis not present

## 2016-06-19 DIAGNOSIS — F5104 Psychophysiologic insomnia: Secondary | ICD-10-CM | POA: Diagnosis not present

## 2016-06-19 DIAGNOSIS — K5909 Other constipation: Secondary | ICD-10-CM | POA: Diagnosis not present

## 2016-07-06 DIAGNOSIS — Z1212 Encounter for screening for malignant neoplasm of rectum: Secondary | ICD-10-CM | POA: Diagnosis not present

## 2016-11-06 DIAGNOSIS — Z1231 Encounter for screening mammogram for malignant neoplasm of breast: Secondary | ICD-10-CM | POA: Diagnosis not present

## 2016-11-22 ENCOUNTER — Ambulatory Visit (INDEPENDENT_AMBULATORY_CARE_PROVIDER_SITE_OTHER): Payer: Medicare Other | Admitting: Nurse Practitioner

## 2016-11-22 ENCOUNTER — Encounter: Payer: Self-pay | Admitting: Nurse Practitioner

## 2016-11-22 VITALS — BP 110/74 | HR 76 | Ht 65.0 in | Wt 127.0 lb

## 2016-11-22 DIAGNOSIS — E559 Vitamin D deficiency, unspecified: Secondary | ICD-10-CM | POA: Diagnosis not present

## 2016-11-22 DIAGNOSIS — Z01419 Encounter for gynecological examination (general) (routine) without abnormal findings: Secondary | ICD-10-CM | POA: Diagnosis not present

## 2016-11-22 DIAGNOSIS — Z0000 Encounter for general adult medical examination without abnormal findings: Secondary | ICD-10-CM | POA: Diagnosis not present

## 2016-11-22 DIAGNOSIS — Z7289 Other problems related to lifestyle: Secondary | ICD-10-CM | POA: Diagnosis not present

## 2016-11-22 DIAGNOSIS — Z1159 Encounter for screening for other viral diseases: Secondary | ICD-10-CM

## 2016-11-22 DIAGNOSIS — Z124 Encounter for screening for malignant neoplasm of cervix: Secondary | ICD-10-CM | POA: Diagnosis not present

## 2016-11-22 NOTE — Patient Instructions (Addendum)

## 2016-11-22 NOTE — Progress Notes (Signed)
Encounter reviewed by Dr. Aundria Rud. Mammogram report from 2018 pending and will be scanned in.

## 2016-11-22 NOTE — Progress Notes (Signed)
71 y.o. G67P2002 Married  Caucasian Fe here for annual exam.  Last Vit D was low at PCP and now taking Vit D RX weekly and also started on Prenatal.  Patient's last menstrual period was 07/24/2000 (approximate).          Sexually active: Yes.    The current method of family planning is post menopausal status.    Exercising: Yes.  Treadmill everyday. Smoker:  no  Health Maintenance: Pap: 11/22/15, Negative with neg HR HPV  06/08/10, Negative with neg HR HPV History of Abnormal Pap: no MMG: 10/2016, 3D- report pending. Self Breast exams: yes Colonoscopy: 10/15/13, Tubular Adenoma, repeat in 5 years BMD: 09/26/11 T Score, -1.9 Spine / -1.1 Right Femur Neck / -1.2 Left Femur Neck (GGA) TDaP: 2010 Shingles: completed Pneumonia: 2015 Hep C: did not do with PCP in 2017 - will do today Labs: PCP takes care of all labs   reports that she has never smoked. She has never used smokeless tobacco. She reports that she drinks alcohol. She reports that she does not use drugs.  Past Medical History:  Diagnosis Date  . Arthritis    rt index finger  . Atrophic vaginitis   . Idiopathic edema    fluid retention  . MVP (mitral valve prolapse)    Antibiotics are NOT require for procedures  . Osteopenia   . PONV (postoperative nausea and vomiting)     Past Surgical History:  Procedure Laterality Date  . CHOLECYSTECTOMY, LAPAROSCOPIC  1991  . DISTAL INTERPHALANGEAL JOINT FUSION Right 04/27/2014   Procedure: RIGHT INDEX FINGER DISTAL INTERPHALANGEAL JOINT FUSION;  Surgeon: Jolyn Nap, MD;  Location: [facility];  Service: Orthopedics;  Laterality: Right;  . SHOULDER SURGERY Right 12/01/1999  . TONSILLECTOMY AND ADENOIDECTOMY  childhood    Current Outpatient Prescriptions  Medication Sig Dispense Refill  . aspirin 81 MG tablet Take 81 mg by mouth daily.    Marland Kitchen triamterene-hydrochlorothiazide (DYAZIDE) 37.5-25 MG per capsule Take 1 capsule by mouth daily as needed (swelling).    .  Vitamin D, Ergocalciferol, (DRISDOL) 50000 UNITS CAPS capsule TAKE 1 CAPSULE BY MOUTH ONCE WEEKLY (Patient taking differently: Take 50,000 Units by mouth every 7 (seven) days. VITAMIN D) 30 capsule 3   No current facility-administered medications for this visit.     Family History  Problem Relation Age of Onset  . Diabetes Mother   . Hypertension Mother   . Stroke Mother   . Heart disease Father   . Colon cancer Father   . Alzheimer's disease Father   . Stroke Maternal Grandmother   . Diabetes Paternal Grandmother     ROS:  Pertinent items are noted in HPI.  Otherwise, a comprehensive ROS was negative.  Exam:   BP 110/74 (BP Location: Right Arm, Patient Position: Sitting, Cuff Size: Normal)   Pulse 76   Ht 5\' 5"  (1.651 m)   Wt 127 lb (57.6 kg)   LMP 07/24/2000 (Approximate)   BMI 21.13 kg/m  Height: 5\' 5"  (165.1 cm) Ht Readings from Last 3 Encounters:  11/22/16 5\' 5"  (1.651 m)  11/22/15 5\' 5"  (1.651 m)  11/19/14 5\' 5"  (1.651 m)    General appearance: alert, cooperative and appears stated age Head: Normocephalic, without obvious abnormality, atraumatic Neck: no adenopathy, supple, symmetrical, trachea midline and thyroid normal to inspection and palpation Lungs: clear to auscultation bilaterally Breasts: normal appearance, no masses or tenderness Heart: regular rate and rhythm Abdomen: soft, non-tender; no masses,  no organomegaly  Extremities: extremities normal, atraumatic, no cyanosis or edema Skin: Skin color, texture, turgor normal. No rashes or lesions Lymph nodes: Cervical, supraclavicular, and axillary nodes normal. No abnormal inguinal nodes palpated Neurologic: Grossly normal   Pelvic: External genitalia:  no lesions              Urethra:  normal appearing urethra with no masses, tenderness or lesions              Bartholin's and Skene's: normal                 Vagina: normal appearing vagina with normal color and discharge, no lesions              Cervix:  anteverted              Pap taken: No. Bimanual Exam:  Uterus:  normal size, contour, position, consistency, mobility, non-tender              Adnexa: no mass, fullness, tenderness               Rectovaginal: Confirms               Anus:  normal sphincter tone, no lesions  Chaperone present: yes  A:  Well Woman with normal exam  Postmenopausal no HRT Atrophic vaginitis declines treatment Vit D deficiency PCP manages - now RX dose at every 7 days   P:   Reviewed health and wellness pertinent to exam  Pap smear: no  Mammogram is due 09/2016  Will follow with Hep C  Order for BMD at [facility] is faxed  Counseled on breast self exam, mammography screening, adequate intake of calcium and vitamin D, diet and exercise, Kegel's exercises return annually or prn  An After Visit Summary was printed and given to the patient.

## 2016-11-23 LAB — HEPATITIS C ANTIBODY: HCV Ab: NEGATIVE

## 2016-12-20 DIAGNOSIS — L821 Other seborrheic keratosis: Secondary | ICD-10-CM | POA: Diagnosis not present

## 2016-12-20 DIAGNOSIS — D225 Melanocytic nevi of trunk: Secondary | ICD-10-CM | POA: Diagnosis not present

## 2016-12-20 DIAGNOSIS — L814 Other melanin hyperpigmentation: Secondary | ICD-10-CM | POA: Diagnosis not present

## 2016-12-20 DIAGNOSIS — B078 Other viral warts: Secondary | ICD-10-CM | POA: Diagnosis not present

## 2017-01-25 DIAGNOSIS — B078 Other viral warts: Secondary | ICD-10-CM | POA: Diagnosis not present

## 2017-01-29 DIAGNOSIS — M810 Age-related osteoporosis without current pathological fracture: Secondary | ICD-10-CM

## 2017-01-29 HISTORY — DX: Age-related osteoporosis without current pathological fracture: M81.0

## 2017-02-08 ENCOUNTER — Telehealth: Payer: Self-pay | Admitting: Certified Nurse Midwife

## 2017-02-08 NOTE — Telephone Encounter (Signed)
Patient is calling to see if her bone density results are in yet.

## 2017-02-09 NOTE — Telephone Encounter (Signed)
Do not have them in my folder

## 2017-02-09 NOTE — Telephone Encounter (Signed)
Melvia Heaps, CNM -have you received and reviewed BMD results?

## 2017-02-12 NOTE — Telephone Encounter (Signed)
Spoke with [employer]. Requested BMD results dated 01/29/17 to be faxed to [phone].

## 2017-02-12 NOTE — Telephone Encounter (Signed)
Lauren Thompson, CNM -results placed on desk for review.

## 2017-02-13 NOTE — Telephone Encounter (Signed)
Spoke with patient. Advised of BMD results and recommendations per Melvia Heaps, CNM. Patient scheduled for OV on 02/15/17 at 1:30pm with Dr. Quincy Simmonds. Patient verbalizes understanding and is agreeable.  Copy of results to Dr. Quincy Simmonds for review, then scan.   Routing to provider for final review. Patient is agreeable to disposition. Will close encounter.  Cc: Melvia Heaps, CNM

## 2017-02-15 ENCOUNTER — Ambulatory Visit (INDEPENDENT_AMBULATORY_CARE_PROVIDER_SITE_OTHER): Payer: Medicare Other | Admitting: Obstetrics and Gynecology

## 2017-02-15 ENCOUNTER — Encounter: Payer: Self-pay | Admitting: Obstetrics and Gynecology

## 2017-02-15 VITALS — BP 126/72 | HR 76 | Ht 65.0 in | Wt 129.0 lb

## 2017-02-15 DIAGNOSIS — H6123 Impacted cerumen, bilateral: Secondary | ICD-10-CM | POA: Diagnosis not present

## 2017-02-15 DIAGNOSIS — M810 Age-related osteoporosis without current pathological fracture: Secondary | ICD-10-CM | POA: Diagnosis not present

## 2017-02-15 DIAGNOSIS — K5900 Constipation, unspecified: Secondary | ICD-10-CM | POA: Diagnosis not present

## 2017-02-15 NOTE — Progress Notes (Signed)
GYNECOLOGY  VISIT   HPI: [age] y.o.   Married  Caucasian  female   G2P2002 with Patient's last menstrual period was 07/24/2000 (approximate).   here for discuss bone density results.  BMD at [facility]  on 01/29/17.  T score of right hip -1.6. (was -1.1 in 2013) T score of left hip -1.9. (was -1.2 in 2013) T score of spine -2.5. (was -1.9 in 2013)  No chronic steroid use or diarrhea. Not a smoker ever.  Red wine once a week.  No known anemia.  Hx low vit D. Is treating with prescription vit D.  In December increased to vit D 50,000 IU weekly.  Takes PNV as well.   No prior fracture other than nasal fracture 8 year ago.   No FH of osteoporosis.   Treadmill 2 miles per day. Sporadic use of weights.   Hx UTIs.   Retired Pharmacist, hospital.  Married for 49 years.  6 grandchildren.   GYNECOLOGIC HISTORY: Patient's last menstrual period was 07/24/2000 (approximate). Contraception:  postmenopausal Menopausal hormone therapy:  N/A Last mammogram:  10/2016, No report in EPIC Last pap smear:   11/22/15, Negative with neg HR HPV        OB History    Gravida Para Term Preterm AB Living   2 2 2  0 0 2   SAB TAB Ectopic Multiple Live Births   0 0 0 0 2         Patient Active Problem List   Diagnosis Date Noted  . MVP (mitral valve prolapse)   . Atrophic vaginitis   . Osteopenia     Past Medical History:  Diagnosis Date  . Arthritis    rt index finger  . Atrophic vaginitis   . Idiopathic edema    fluid retention  . MVP (mitral valve prolapse)    Antibiotics are NOT require for procedures  . Osteoporosis 01/29/2017   Spine  . PONV (postoperative nausea and vomiting)     Past Surgical History:  Procedure Laterality Date  . CHOLECYSTECTOMY, LAPAROSCOPIC  1991  . DISTAL INTERPHALANGEAL JOINT FUSION Right 04/27/2014   Procedure: RIGHT INDEX FINGER DISTAL INTERPHALANGEAL JOINT FUSION;  Surgeon: Jolyn Nap, MD;  Location: [facility];  Service: Orthopedics;  Laterality:  Right;  . SHOULDER SURGERY Right 12/01/1999  . TONSILLECTOMY AND ADENOIDECTOMY  childhood    Current Outpatient Prescriptions  Medication Sig Dispense Refill  . aspirin 81 MG tablet Take 81 mg by mouth daily.    Marland Kitchen triamterene-hydrochlorothiazide (DYAZIDE) 37.5-25 MG per capsule Take 1 capsule by mouth daily as needed (swelling).    . Vitamin D, Ergocalciferol, (DRISDOL) 50000 UNITS CAPS capsule TAKE 1 CAPSULE BY MOUTH ONCE WEEKLY (Patient taking differently: Take 50,000 Units by mouth every 7 (seven) days. VITAMIN D) 30 capsule 3   No current facility-administered medications for this visit.      ALLERGIES: Sulfa antibiotics  Family History  Problem Relation Age of Onset  . Diabetes Mother   . Hypertension Mother   . Stroke Mother   . Heart disease Father   . Colon cancer Father   . Alzheimer's disease Father   . Stroke Maternal Grandmother   . Diabetes Paternal Grandmother     Social History   Social History  . Marital status: Married    Spouse name: N/A  . Number of children: 2  . Years of education: N/A   Occupational History  . [employer]   Social History Main Topics  .  Smoking status: Never Smoker  . Smokeless tobacco: Never Used  . Alcohol use Yes     Comment: rare  . Drug use: No  . Sexual activity: Yes    Birth control/ protection: Post-menopausal   Other Topics Concern  . Not on file   Social History Narrative  . No narrative on file    ROS:  Pertinent items are noted in HPI.  PHYSICAL EXAMINATION:    BP 126/72 (BP Location: Right Arm, Patient Position: Sitting, Cuff Size: Normal)   Pulse 76   Ht 5\' 5"  (1.651 m)   Wt 129 lb (58.5 kg)   LMP 07/24/2000 (Approximate)   BMI 21.47 kg/m     General appearance: alert, cooperative and appears stated age   ASSESSMENT  Osteoporosis.  Low vit D.  PLAN  PTH, Calcium, TSH, Vit D Discussed osteoporosis and tx options.  Highlighted Fosamax.  Discussed reducing risk of falls.  BMD in  2 years.    An After Visit Summary was printed and given to the patient.  ____25__ minutes face to face time of which over 50% was spent in counseling.

## 2017-02-15 NOTE — Patient Instructions (Addendum)
Osteoporosis Osteoporosis is the thinning and loss of density in the bones. Osteoporosis makes the bones more brittle, fragile, and likely to break (fracture). Over time, osteoporosis can cause the bones to become so weak that they fracture after a simple fall. The bones most likely to fracture are the bones in the hip, wrist, and spine. What are the causes? The exact cause is not known. What increases the risk? Anyone can develop osteoporosis. You may be at greater risk if you have a family history of the condition or have poor nutrition. You may also have a higher risk if you are:  Female.  69 years old or older.  A smoker.  Not physically active.  White or Asian.  Slender.  What are the signs or symptoms? A fracture might be the first sign of the disease, especially if it results from a fall or injury that would not usually cause a bone to break. Other signs and symptoms include:  Low back and neck pain.  Stooped posture.  Height loss.  How is this diagnosed? To make a diagnosis, your health care provider may:  Take a medical history.  Perform a physical exam.  Order tests, such as: ? A bone mineral density test. ? A dual-energy X-ray absorptiometry test.  How is this treated? The goal of osteoporosis treatment is to strengthen your bones to reduce your risk of a fracture. Treatment may involve:  Making lifestyle changes, such as: ? Eating a diet rich in calcium. ? Doing weight-bearing and muscle-strengthening exercises. ? Stopping tobacco use. ? Limiting alcohol intake.  Taking medicine to slow the process of bone loss or to increase bone density.  Monitoring your levels of calcium and vitamin D.  Follow these instructions at home:  Include calcium and vitamin D in your diet. Calcium is important for bone health, and vitamin D helps the body absorb calcium.  Perform weight-bearing and muscle-strengthening exercises as directed by your health care  provider.  Do not use any tobacco products, including cigarettes, chewing tobacco, and electronic cigarettes. If you need help quitting, ask your health care provider.  Limit your alcohol intake.  Take medicines only as directed by your health care provider.  Keep all follow-up visits as directed by your health care provider. This is important.  Take precautions at home to lower your risk of falling, such as: ? Keeping rooms well lit and clutter free. ? Installing safety rails on stairs. ? Using rubber mats in the bathroom and other areas that are often wet or slippery. Get help right away if: You fall or injure yourself. This information is not intended to replace advice given to you by your health care provider. Make sure you discuss any questions you have with your health care provider. Document Released: 04/19/2005 Document Revised: 12/13/2015 Document Reviewed: 12/18/2013 Elsevier Interactive Patient Education  2017 [employer].  Alendronate tablets What is this medicine? ALENDRONATE (a LEN droe nate) slows calcium loss from bones. It helps to make normal healthy bone and to slow bone loss in people with Paget's disease and osteoporosis. It may be used in others at risk for bone loss. This medicine may be used for other purposes; ask your health care provider or pharmacist if you have questions. COMMON BRAND NAME(S): Fosamax What should I tell my health care provider before I take this medicine? They need to know if you have any of these conditions: -dental disease -esophagus, stomach, or intestine problems, like acid reflux or GERD -kidney disease -low  blood calcium -low vitamin D -problems sitting or standing 30 minutes -trouble swallowing -an unusual or allergic reaction to alendronate, other medicines, foods, dyes, or preservatives -pregnant or trying to get pregnant -breast-feeding How should I use this medicine? You must take this medicine exactly as directed or you  will lower the amount of the medicine you absorb into your body or you may cause yourself harm. Take this medicine by mouth first thing in the morning, after you are up for the day. Do not eat or drink anything before you take your medicine. Swallow the tablet with a full glass (6 to 8 fluid ounces) of plain water. Do not take this medicine with any other drink. Do not chew or crush the tablet. After taking this medicine, do not eat breakfast, drink, or take any medicines or vitamins for at least 30 minutes. Sit or stand up for at least 30 minutes after you take this medicine; do not lie down. Do not take your medicine more often than directed. Talk to your pediatrician regarding the use of this medicine in children. Special care may be needed. Overdosage: If you think you have taken too much of this medicine contact a poison control center or emergency room at once. NOTE: This medicine is only for you. Do not share this medicine with others. What if I miss a dose? If you miss a dose, do not take it later in the day. Continue your normal schedule starting the next morning. Do not take double or extra doses. What may interact with this medicine? -aluminum hydroxide -antacids -aspirin -calcium supplements -drugs for inflammation like ibuprofen, naproxen, and others -iron supplements -magnesium supplements -vitamins with minerals This list may not describe all possible interactions. Give your health care provider a list of all the medicines, herbs, non-prescription drugs, or dietary supplements you use. Also tell them if you smoke, drink alcohol, or use illegal drugs. Some items may interact with your medicine. What should I watch for while using this medicine? Visit your doctor or health care professional for regular checks ups. It may be some time before you see benefit from this medicine. Do not stop taking your medicine except on your doctor's advice. Your doctor or health care professional may  order blood tests and other tests to see how you are doing. You should make sure you get enough calcium and vitamin D while you are taking this medicine, unless your doctor tells you not to. Discuss the foods you eat and the vitamins you take with your health care professional. Some people who take this medicine have severe bone, joint, and/or muscle pain. This medicine may also increase your risk for a broken thigh bone. Tell your doctor right away if you have pain in your upper leg or groin. Tell your doctor if you have any pain that does not go away or that gets worse. This medicine can make you more sensitive to the sun. If you get a rash while taking this medicine, sunlight may cause the rash to get worse. Keep out of the sun. If you cannot avoid being in the sun, wear protective clothing and use sunscreen. Do not use sun lamps or tanning beds/booths. What side effects may I notice from receiving this medicine? Side effects that you should report to your doctor or health care professional as soon as possible: -allergic reactions like skin rash, itching or hives, swelling of the face, lips, or tongue -black or tarry stools -bone, muscle or joint pain -changes in  vision -chest pain -heartburn or stomach pain -jaw pain, especially after dental work -pain or trouble when swallowing -redness, blistering, peeling or loosening of the skin, including inside the mouth Side effects that usually do not require medical attention (report to your doctor or health care professional if they continue or are bothersome): -changes in taste -diarrhea or constipation -eye pain or itching -headache -nausea or vomiting -stomach gas or fullness This list may not describe all possible side effects. Call your doctor for medical advice about side effects. You may report side effects to FDA at 1-800-FDA-1088. Where should I keep my medicine? Keep out of the reach of children. Store at room temperature of 15 and 30  degrees C (59 and 86 degrees F). Throw away any unused medicine after the expiration date. NOTE: This sheet is a summary. It may not cover all possible information. If you have questions about this medicine, talk to your doctor, pharmacist, or health care provider.  2018 Elsevier/Gold Standard (2011-01-06 08:56:09)

## 2017-02-16 LAB — PTH, INTACT AND CALCIUM
CALCIUM: 9 mg/dL (ref 8.7–10.3)
PTH: 35 pg/mL (ref 15–65)

## 2017-02-16 LAB — VITAMIN D 25 HYDROXY (VIT D DEFICIENCY, FRACTURES): Vit D, 25-Hydroxy: 59.6 ng/mL (ref 30.0–100.0)

## 2017-02-16 LAB — TSH: TSH: 1.26 u[IU]/mL (ref 0.450–4.500)

## 2017-02-22 DIAGNOSIS — J328 Other chronic sinusitis: Secondary | ICD-10-CM | POA: Diagnosis not present

## 2017-02-22 DIAGNOSIS — F5104 Psychophysiologic insomnia: Secondary | ICD-10-CM | POA: Diagnosis not present

## 2017-02-22 DIAGNOSIS — M542 Cervicalgia: Secondary | ICD-10-CM | POA: Diagnosis not present

## 2017-02-22 DIAGNOSIS — Z6821 Body mass index (BMI) 21.0-21.9, adult: Secondary | ICD-10-CM | POA: Diagnosis not present

## 2017-02-22 DIAGNOSIS — Z1389 Encounter for screening for other disorder: Secondary | ICD-10-CM | POA: Diagnosis not present

## 2017-02-22 DIAGNOSIS — M810 Age-related osteoporosis without current pathological fracture: Secondary | ICD-10-CM | POA: Diagnosis not present

## 2017-02-26 DIAGNOSIS — J019 Acute sinusitis, unspecified: Secondary | ICD-10-CM | POA: Diagnosis not present

## 2017-03-06 ENCOUNTER — Other Ambulatory Visit: Payer: Self-pay | Admitting: Internal Medicine

## 2017-03-06 DIAGNOSIS — B078 Other viral warts: Secondary | ICD-10-CM | POA: Diagnosis not present

## 2017-03-06 DIAGNOSIS — J329 Chronic sinusitis, unspecified: Secondary | ICD-10-CM

## 2017-03-08 ENCOUNTER — Ambulatory Visit
Admission: RE | Admit: 2017-03-08 | Discharge: 2017-03-08 | Disposition: A | Discharge status: Home / Self Care | Payer: Medicare Other | Source: Ambulatory Visit | Attending: Internal Medicine | Admitting: Internal Medicine

## 2017-03-08 DIAGNOSIS — J329 Chronic sinusitis, unspecified: Secondary | ICD-10-CM

## 2017-03-08 DIAGNOSIS — J0100 Acute maxillary sinusitis, unspecified: Secondary | ICD-10-CM | POA: Diagnosis not present

## 2017-03-13 DIAGNOSIS — Z6821 Body mass index (BMI) 21.0-21.9, adult: Secondary | ICD-10-CM | POA: Diagnosis not present

## 2017-03-13 DIAGNOSIS — H6591 Unspecified nonsuppurative otitis media, right ear: Secondary | ICD-10-CM | POA: Diagnosis not present

## 2017-05-19 DIAGNOSIS — Z23 Encounter for immunization: Secondary | ICD-10-CM | POA: Diagnosis not present

## 2017-06-21 DIAGNOSIS — M859 Disorder of bone density and structure, unspecified: Secondary | ICD-10-CM | POA: Diagnosis not present

## 2017-06-21 DIAGNOSIS — Z0000 Encounter for general adult medical examination without abnormal findings: Secondary | ICD-10-CM | POA: Diagnosis not present

## 2017-06-25 DIAGNOSIS — R82998 Other abnormal findings in urine: Secondary | ICD-10-CM | POA: Diagnosis not present

## 2017-06-25 DIAGNOSIS — Z1389 Encounter for screening for other disorder: Secondary | ICD-10-CM | POA: Diagnosis not present

## 2017-06-25 DIAGNOSIS — Z6821 Body mass index (BMI) 21.0-21.9, adult: Secondary | ICD-10-CM | POA: Diagnosis not present

## 2017-06-25 DIAGNOSIS — M542 Cervicalgia: Secondary | ICD-10-CM | POA: Diagnosis not present

## 2017-06-25 DIAGNOSIS — M810 Age-related osteoporosis without current pathological fracture: Secondary | ICD-10-CM | POA: Diagnosis not present

## 2017-06-25 DIAGNOSIS — Z0000 Encounter for general adult medical examination without abnormal findings: Secondary | ICD-10-CM | POA: Diagnosis not present

## 2017-06-25 DIAGNOSIS — H9311 Tinnitus, right ear: Secondary | ICD-10-CM | POA: Diagnosis not present

## 2017-08-07 DIAGNOSIS — T1501XA Foreign body in cornea, right eye, initial encounter: Secondary | ICD-10-CM | POA: Diagnosis not present

## 2017-08-09 DIAGNOSIS — B078 Other viral warts: Secondary | ICD-10-CM | POA: Diagnosis not present

## 2017-08-23 DIAGNOSIS — L570 Actinic keratosis: Secondary | ICD-10-CM | POA: Diagnosis not present

## 2017-08-23 DIAGNOSIS — B078 Other viral warts: Secondary | ICD-10-CM | POA: Diagnosis not present

## 2017-09-04 DIAGNOSIS — J09X2 Influenza due to identified novel influenza A virus with other respiratory manifestations: Secondary | ICD-10-CM | POA: Diagnosis not present

## 2017-09-04 DIAGNOSIS — R509 Fever, unspecified: Secondary | ICD-10-CM | POA: Diagnosis not present

## 2017-09-04 DIAGNOSIS — R21 Rash and other nonspecific skin eruption: Secondary | ICD-10-CM | POA: Diagnosis not present

## 2017-09-04 DIAGNOSIS — Z6821 Body mass index (BMI) 21.0-21.9, adult: Secondary | ICD-10-CM | POA: Diagnosis not present

## 2017-09-13 DIAGNOSIS — D2261 Melanocytic nevi of right upper limb, including shoulder: Secondary | ICD-10-CM | POA: Diagnosis not present

## 2017-09-13 DIAGNOSIS — L309 Dermatitis, unspecified: Secondary | ICD-10-CM | POA: Diagnosis not present

## 2017-09-13 DIAGNOSIS — D225 Melanocytic nevi of trunk: Secondary | ICD-10-CM | POA: Diagnosis not present

## 2017-09-13 DIAGNOSIS — B078 Other viral warts: Secondary | ICD-10-CM | POA: Diagnosis not present

## 2017-09-13 DIAGNOSIS — L814 Other melanin hyperpigmentation: Secondary | ICD-10-CM | POA: Diagnosis not present

## 2017-09-13 DIAGNOSIS — L821 Other seborrheic keratosis: Secondary | ICD-10-CM | POA: Diagnosis not present

## 2017-09-13 DIAGNOSIS — D485 Neoplasm of uncertain behavior of skin: Secondary | ICD-10-CM | POA: Diagnosis not present

## 2017-09-13 DIAGNOSIS — D2262 Melanocytic nevi of left upper limb, including shoulder: Secondary | ICD-10-CM | POA: Diagnosis not present

## 2017-10-04 DIAGNOSIS — T1511XA Foreign body in conjunctival sac, right eye, initial encounter: Secondary | ICD-10-CM | POA: Diagnosis not present

## 2017-10-17 DIAGNOSIS — M25572 Pain in left ankle and joints of left foot: Secondary | ICD-10-CM | POA: Diagnosis not present

## 2017-11-05 DIAGNOSIS — Z1231 Encounter for screening mammogram for malignant neoplasm of breast: Secondary | ICD-10-CM | POA: Diagnosis not present

## 2017-11-07 DIAGNOSIS — L570 Actinic keratosis: Secondary | ICD-10-CM | POA: Diagnosis not present

## 2017-11-07 DIAGNOSIS — L578 Other skin changes due to chronic exposure to nonionizing radiation: Secondary | ICD-10-CM | POA: Diagnosis not present

## 2017-11-27 ENCOUNTER — Ambulatory Visit: Payer: Medicare Other | Admitting: Nurse Practitioner

## 2017-12-04 ENCOUNTER — Other Ambulatory Visit: Payer: Self-pay

## 2017-12-04 ENCOUNTER — Ambulatory Visit (INDEPENDENT_AMBULATORY_CARE_PROVIDER_SITE_OTHER): Payer: Medicare Other | Admitting: Certified Nurse Midwife

## 2017-12-04 ENCOUNTER — Encounter: Payer: Self-pay | Admitting: Certified Nurse Midwife

## 2017-12-04 VITALS — BP 124/70 | HR 68 | Resp 16 | Ht 64.75 in | Wt 126.0 lb

## 2017-12-04 DIAGNOSIS — N952 Postmenopausal atrophic vaginitis: Secondary | ICD-10-CM | POA: Diagnosis not present

## 2017-12-04 DIAGNOSIS — Z780 Asymptomatic menopausal state: Secondary | ICD-10-CM

## 2017-12-04 DIAGNOSIS — Z01419 Encounter for gynecological examination (general) (routine) without abnormal findings: Secondary | ICD-10-CM

## 2017-12-04 DIAGNOSIS — Z124 Encounter for screening for malignant neoplasm of cervix: Secondary | ICD-10-CM

## 2017-12-04 DIAGNOSIS — Z8739 Personal history of other diseases of the musculoskeletal system and connective tissue: Secondary | ICD-10-CM

## 2017-12-04 NOTE — Patient Instructions (Signed)

## 2017-12-04 NOTE — Progress Notes (Signed)
72 y.o. G71P2002 Married  Caucasian Fe here for annual exam. Menopausal no vaginal bleeding some vaginal dryness. Has Osteoporosis and chose not to do medication. Working with weights and walking for exercise. Taking Vitamin D  Rx with PCP. Sees PCP for aex and labs, all normal per patient. Occasional constipation. Has been trying to eat well and eats Yogurt daily. Not sure if she is drinking enough water.  Will be living in [place] for 3 months this summer! No other health concerns today.  Patient's last menstrual period was 07/24/2000 (approximate).          Sexually active: Yes.    The current method of family planning is post menopausal status.    Exercising: Yes.    walking & weights Smoker:  no  Health Maintenance: Pap:  11-22-15 neg  History of Abnormal Pap: no MMG:  3/19 waiting on fax Self Breast exams: yes Colonoscopy:  2015 tubular adenoma, f/u 26yrs BMD:   2018 TDaP:  2010 Shingles: done Pneumonia: 2015 Hep C and HIV: Hep c neg 2018 Labs: if needed    reports that she has never smoked. She has never used smokeless tobacco. She reports that she drinks alcohol. She reports that she does not use drugs.  Past Medical History:  Diagnosis Date  . Arthritis    rt index finger  . Atrophic vaginitis   . Idiopathic edema    fluid retention  . MVP (mitral valve prolapse)    Antibiotics are NOT require for procedures  . Osteoporosis 01/29/2017   Spine  . PONV (postoperative nausea and vomiting)     Past Surgical History:  Procedure Laterality Date  . CHOLECYSTECTOMY, LAPAROSCOPIC  1991  . DISTAL INTERPHALANGEAL JOINT FUSION Right 04/27/2014   Procedure: RIGHT INDEX FINGER DISTAL INTERPHALANGEAL JOINT FUSION;  Surgeon: Jolyn Nap, MD;  Location: [facility];  Service: Orthopedics;  Laterality: Right;  . SHOULDER SURGERY Right 12/01/1999  . TONSILLECTOMY AND ADENOIDECTOMY  childhood    Current Outpatient Medications  Medication Sig Dispense Refill  . aspirin  81 MG tablet Take 81 mg by mouth daily.    . Prenatal Vit-Fe Fumarate-FA (PRENATAL VITAMIN PO) Take by mouth.    . Vitamin D, Ergocalciferol, (DRISDOL) 50000 UNITS CAPS capsule TAKE 1 CAPSULE BY MOUTH ONCE WEEKLY (Patient taking differently: Take 50,000 Units by mouth every 7 (seven) days. VITAMIN D) 30 capsule 3   No current facility-administered medications for this visit.     Family History  Problem Relation Age of Onset  . Diabetes Mother   . Hypertension Mother   . Stroke Mother   . Heart disease Father   . Colon cancer Father   . Alzheimer's disease Father   . Stroke Maternal Grandmother   . Diabetes Paternal Grandmother     ROS:  Pertinent items are noted in HPI.  Otherwise, a comprehensive ROS was negative.  Exam:   BP 124/70   Pulse 68   Resp 16   Ht 5' 4.75" (1.645 m)   Wt 126 lb (57.2 kg)   LMP 07/24/2000 (Approximate)   BMI 21.13 kg/m  Height: 5' 4.75" (164.5 cm) Ht Readings from Last 3 Encounters:  12/04/17 5' 4.75" (1.645 m)  02/15/17 5\' 5"  (1.651 m)  11/22/16 5\' 5"  (1.651 m)    General appearance: alert, cooperative and appears stated age Head: Normocephalic, without obvious abnormality, atraumatic Neck: no adenopathy, supple, symmetrical, trachea midline and thyroid normal to inspection and palpation Lungs: clear to auscultation bilaterally  Breasts: normal appearance, no masses or tenderness, No nipple retraction or dimpling, No nipple discharge or bleeding, No axillary or supraclavicular adenopathy Heart: regular rate and rhythm Abdomen: soft, non-tender; no masses,  no organomegaly Extremities: extremities normal, atraumatic, no cyanosis or edema Skin: Skin color, texture, turgor normal. No rashes or lesions Lymph nodes: Cervical, supraclavicular, and axillary nodes normal. No abnormal inguinal nodes palpated Neurologic: Grossly normal   Pelvic: External genitalia:  no lesions, normal atrophic appearance              Urethra:  normal appearing  urethra with no masses, tenderness or lesions              Bartholin's and Skene's: normal                 Vagina: atrophic appearing vagina with normal color and discharge, no lesions              Cervix: no bleeding following Pap and no cervical motion tenderness              Pap taken: No. Bimanual Exam:  Uterus:  normal size, contour, position, consistency, mobility, non-tender and anteverted              Adnexa: normal adnexa and no mass, fullness, tenderness               Rectovaginal: Confirms               Anus:  normal sphincter tone, no lesions  Chaperone present: yes  A:  Well Woman with normal exam  Post menopausal  No HRT.  Osteoporosis working with diet,exercise and calcium for treatment  Atrophic vaginitis  P:   Reviewed health and wellness pertinent to exam  Aware of need to advise if vaginal discomfort or dryness.  Stressed importance of consistent exercise and good diet and weights to maintain bone strength, will need repeat BMD next year  Discussed consistent coconut oil or Olive oil use for comfort and protection and reduction in UTI risk.  Pap smear: no   counseled on breast self exam, mammography screening, feminine hygiene, osteoporosis, adequate intake of calcium and vitamin D, diet and exercise  return annually or prn  An After Visit Summary was printed and given to the patient.

## 2018-01-01 ENCOUNTER — Encounter: Payer: Self-pay | Admitting: Certified Nurse Midwife

## 2018-01-23 DIAGNOSIS — H6981 Other specified disorders of Eustachian tube, right ear: Secondary | ICD-10-CM | POA: Diagnosis not present

## 2018-01-23 DIAGNOSIS — H9313 Tinnitus, bilateral: Secondary | ICD-10-CM | POA: Diagnosis not present

## 2018-02-14 DIAGNOSIS — H6981 Other specified disorders of Eustachian tube, right ear: Secondary | ICD-10-CM | POA: Diagnosis not present

## 2018-02-14 DIAGNOSIS — J309 Allergic rhinitis, unspecified: Secondary | ICD-10-CM | POA: Diagnosis not present

## 2018-02-14 DIAGNOSIS — H9313 Tinnitus, bilateral: Secondary | ICD-10-CM | POA: Diagnosis not present

## 2018-02-22 DIAGNOSIS — L814 Other melanin hyperpigmentation: Secondary | ICD-10-CM | POA: Diagnosis not present

## 2018-02-22 DIAGNOSIS — L821 Other seborrheic keratosis: Secondary | ICD-10-CM | POA: Diagnosis not present

## 2018-03-30 DIAGNOSIS — Z23 Encounter for immunization: Secondary | ICD-10-CM | POA: Diagnosis not present

## 2018-05-01 IMAGING — CT CT MAXILLOFACIAL W/O CM
2 of 3 series · 16 of 37 positions shown, 20 images · non-contrast
Comparison: None.

CLINICAL DATA: Right maxillary sinusitis.

EXAM:
CT MAXILLOFACIAL WITHOUT CONTRAST
TECHNIQUE: Multidetector CT imaging of the maxillofacial structures was
performed. Multiplanar CT image reconstructions were also generated.

[Series 601: coronal facial · coronal · 0.32mm/px · 3 of 78 slices shown]
[im 26/78  bone]
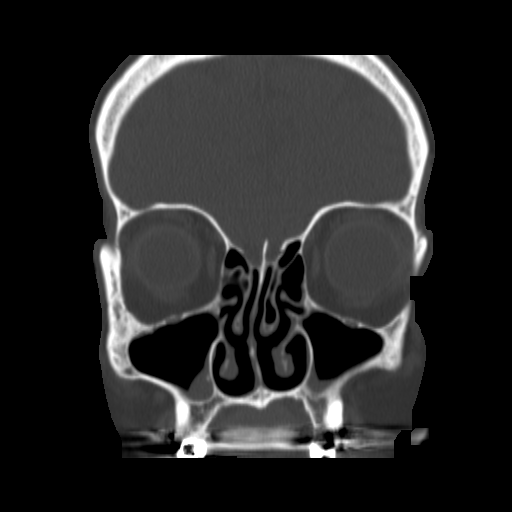
[im 39/78  bone]
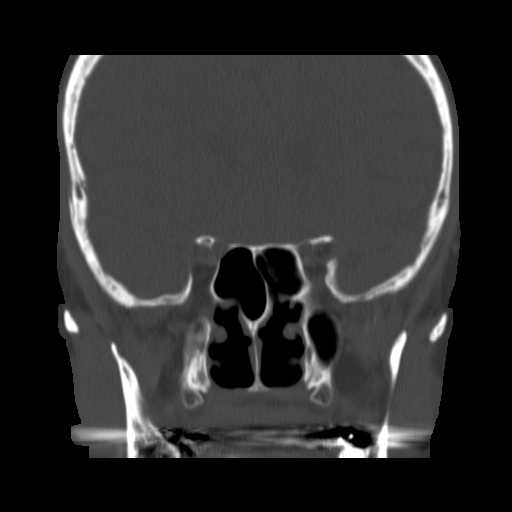
[im 52/78  bone]
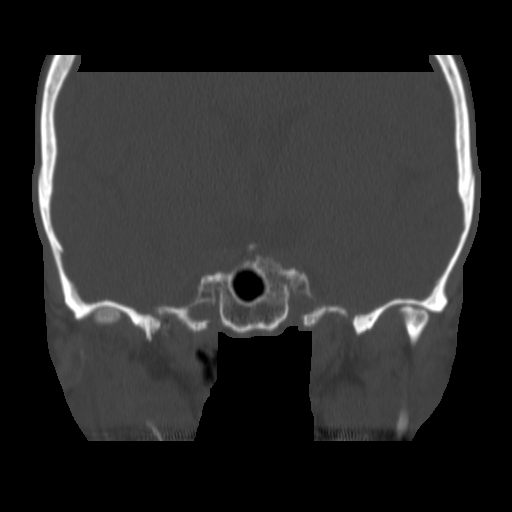

[Series 602: sagittal facial · sagittal · 0.32mm/px · 13 of 78 slices shown, 17 images]
[im 5/78  brain]
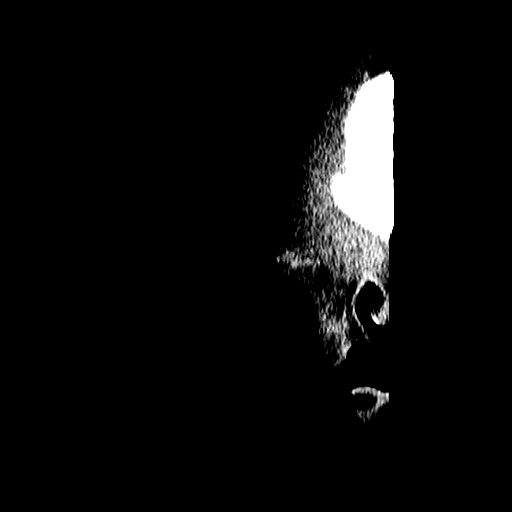
[im 5/78  bone]
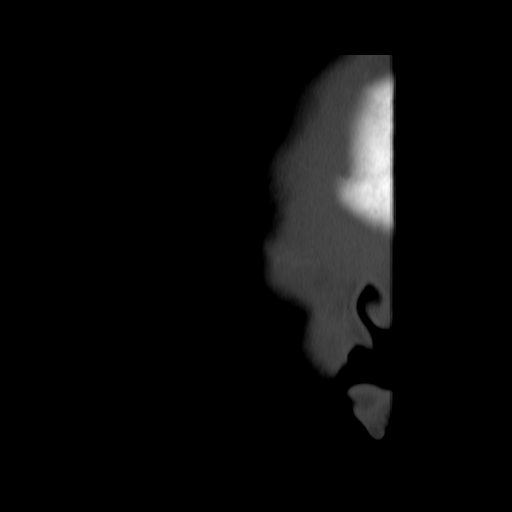
[im 10/78  bone]
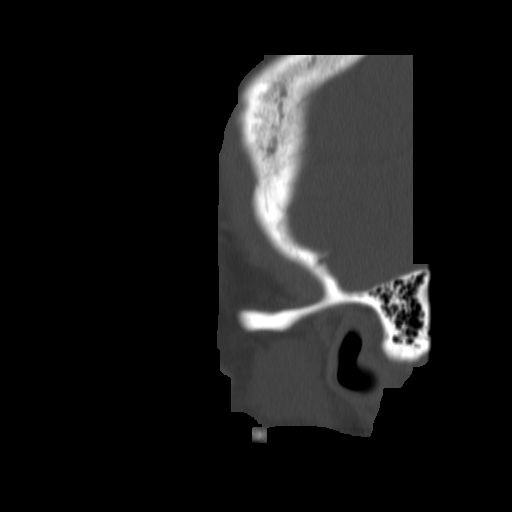
[im 19/78  bone]
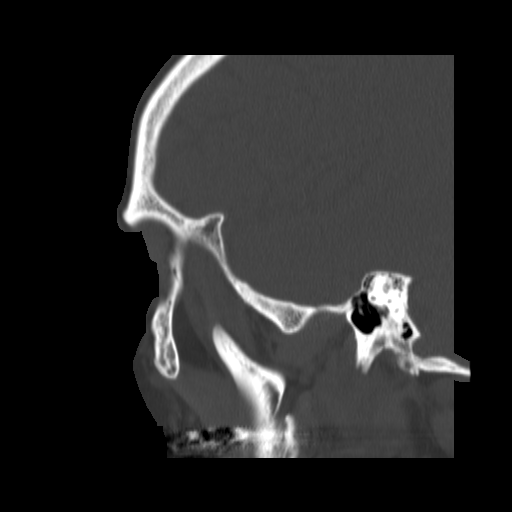
[im 23/78  bone]
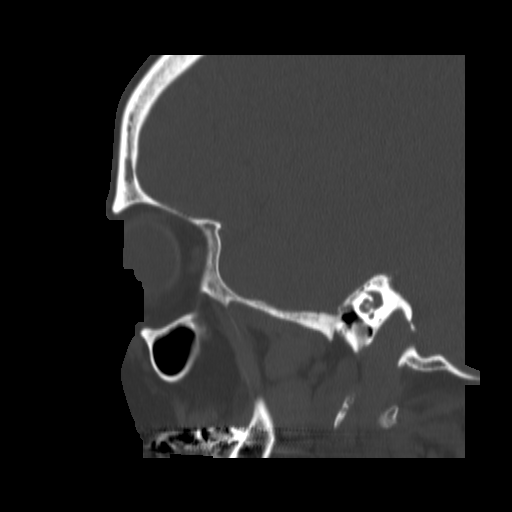
[im 28/78  brain]
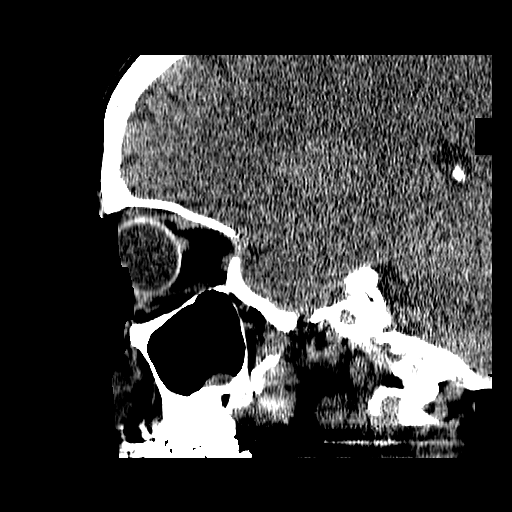
[im 28/78  bone]
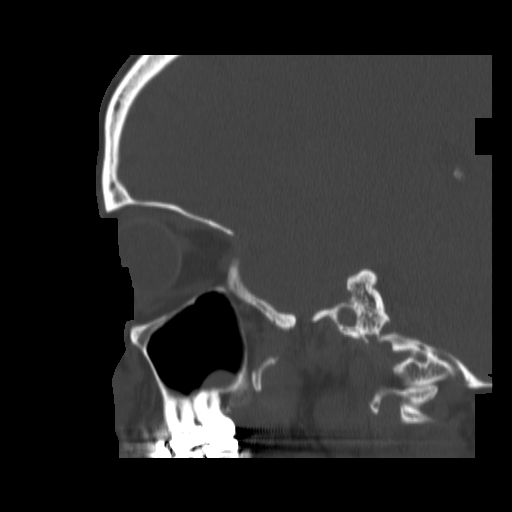
[im 32/78  bone]
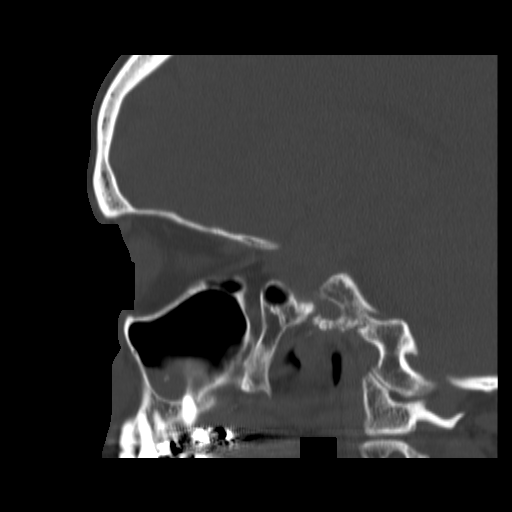
[im 41/78  bone]
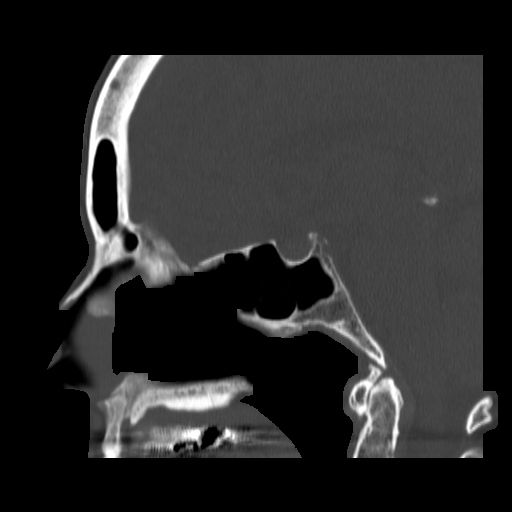
[im 46/78  bone]
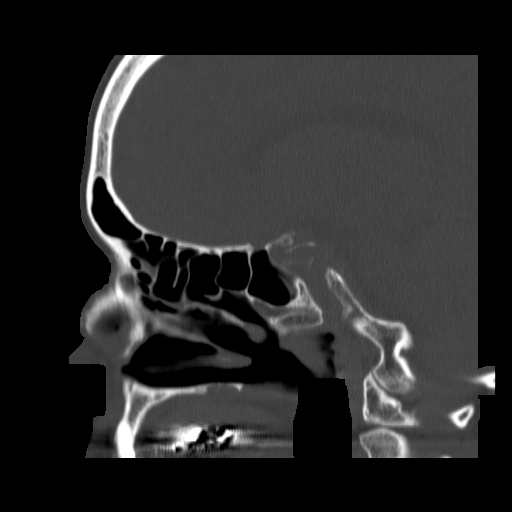
[im 50/78  brain]
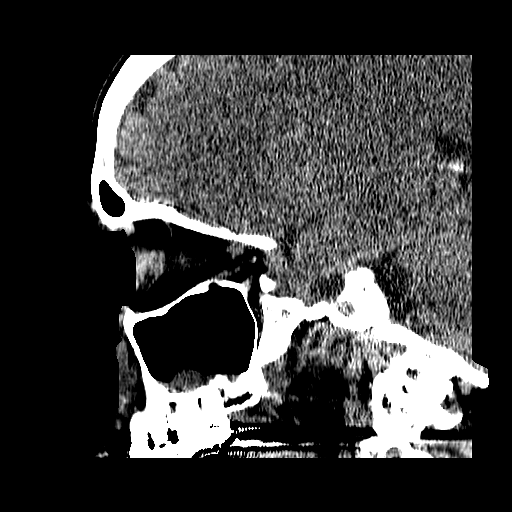
[im 50/78  bone]
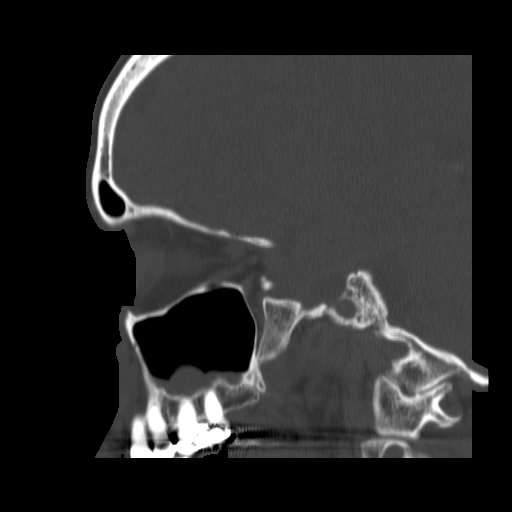
[im 55/78  bone]
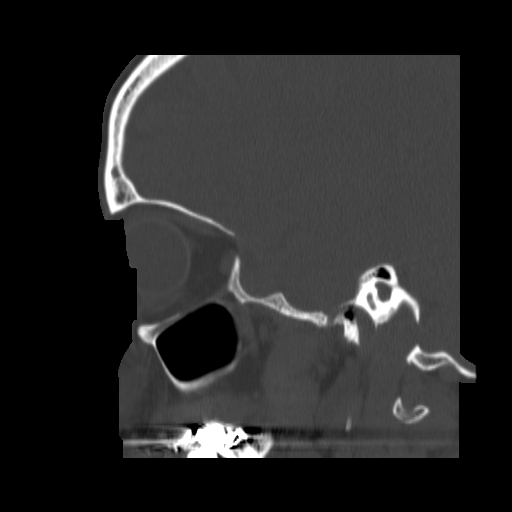
[im 59/78  bone]
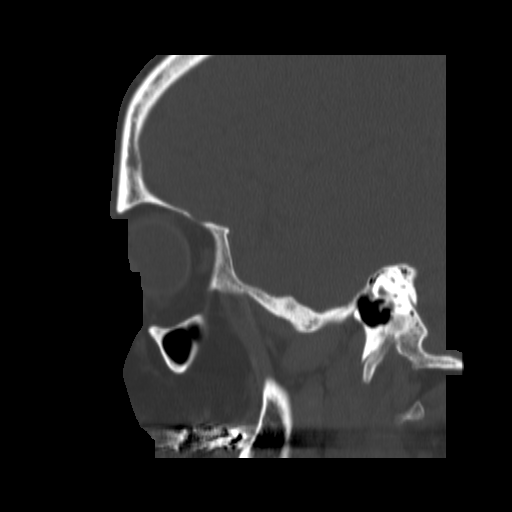
[im 68/78  bone]
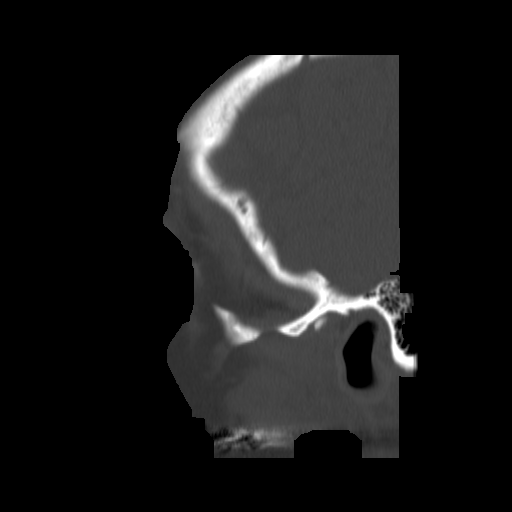
[im 73/78  brain]
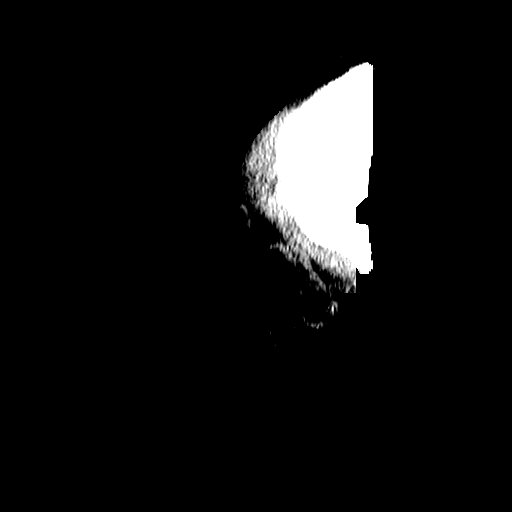
[im 73/78  bone]
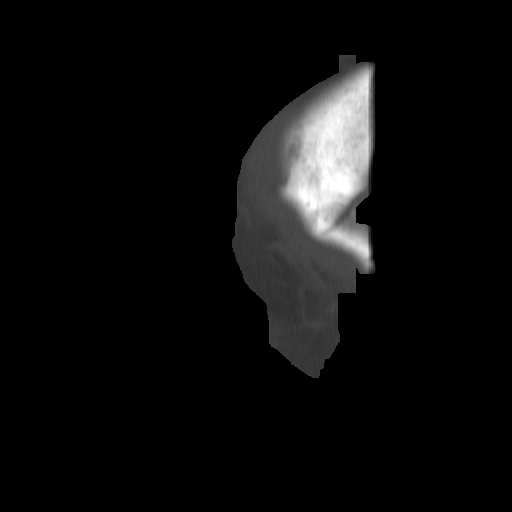

[16 of 37 positions shown; findings below may reference images not displayed]

FINDINGS: Osseous: No fracture or mandibular dislocation. No destructive
process.

Orbits: Negative. No traumatic or inflammatory finding.

Sinuses: Mild mucosal thickening is noted in both maxillary sinuses.

Soft tissues: Negative.

Limited intracranial: No significant or unexpected finding.
IMPRESSION: Mild mucosal thickening is noted in the inferior portion of both
maxillary sinuses suggesting mild chronic sinusitis. No other
abnormality seen in the maxillofacial region.

## 2018-07-08 DIAGNOSIS — M810 Age-related osteoporosis without current pathological fracture: Secondary | ICD-10-CM | POA: Diagnosis not present

## 2018-07-08 DIAGNOSIS — Z79899 Other long term (current) drug therapy: Secondary | ICD-10-CM | POA: Diagnosis not present

## 2018-07-12 DIAGNOSIS — M542 Cervicalgia: Secondary | ICD-10-CM | POA: Diagnosis not present

## 2018-07-12 DIAGNOSIS — Z1212 Encounter for screening for malignant neoplasm of rectum: Secondary | ICD-10-CM | POA: Diagnosis not present

## 2018-07-12 DIAGNOSIS — M810 Age-related osteoporosis without current pathological fracture: Secondary | ICD-10-CM | POA: Diagnosis not present

## 2018-07-12 DIAGNOSIS — Z6820 Body mass index (BMI) 20.0-20.9, adult: Secondary | ICD-10-CM | POA: Diagnosis not present

## 2018-07-12 DIAGNOSIS — R82998 Other abnormal findings in urine: Secondary | ICD-10-CM | POA: Diagnosis not present

## 2018-07-12 DIAGNOSIS — Z1389 Encounter for screening for other disorder: Secondary | ICD-10-CM | POA: Diagnosis not present

## 2018-07-12 DIAGNOSIS — Z0000 Encounter for general adult medical examination without abnormal findings: Secondary | ICD-10-CM | POA: Diagnosis not present

## 2018-08-28 DIAGNOSIS — L814 Other melanin hyperpigmentation: Secondary | ICD-10-CM | POA: Diagnosis not present

## 2018-09-09 DIAGNOSIS — J329 Chronic sinusitis, unspecified: Secondary | ICD-10-CM | POA: Diagnosis not present

## 2018-09-09 DIAGNOSIS — Z6821 Body mass index (BMI) 21.0-21.9, adult: Secondary | ICD-10-CM | POA: Diagnosis not present

## 2018-11-27 DIAGNOSIS — R300 Dysuria: Secondary | ICD-10-CM | POA: Diagnosis not present

## 2018-11-27 DIAGNOSIS — R8290 Unspecified abnormal findings in urine: Secondary | ICD-10-CM | POA: Diagnosis not present

## 2018-11-27 DIAGNOSIS — N390 Urinary tract infection, site not specified: Secondary | ICD-10-CM | POA: Diagnosis not present

## 2018-12-06 ENCOUNTER — Ambulatory Visit: Payer: Medicare Other | Admitting: Certified Nurse Midwife

## 2019-01-01 DIAGNOSIS — D2271 Melanocytic nevi of right lower limb, including hip: Secondary | ICD-10-CM | POA: Diagnosis not present

## 2019-01-01 DIAGNOSIS — D1801 Hemangioma of skin and subcutaneous tissue: Secondary | ICD-10-CM | POA: Diagnosis not present

## 2019-01-01 DIAGNOSIS — L821 Other seborrheic keratosis: Secondary | ICD-10-CM | POA: Diagnosis not present

## 2019-01-01 DIAGNOSIS — D2262 Melanocytic nevi of left upper limb, including shoulder: Secondary | ICD-10-CM | POA: Diagnosis not present

## 2019-01-01 DIAGNOSIS — D225 Melanocytic nevi of trunk: Secondary | ICD-10-CM | POA: Diagnosis not present

## 2019-01-01 DIAGNOSIS — L570 Actinic keratosis: Secondary | ICD-10-CM | POA: Diagnosis not present

## 2019-01-01 DIAGNOSIS — D2261 Melanocytic nevi of right upper limb, including shoulder: Secondary | ICD-10-CM | POA: Diagnosis not present

## 2019-01-01 DIAGNOSIS — B351 Tinea unguium: Secondary | ICD-10-CM | POA: Diagnosis not present

## 2019-01-01 DIAGNOSIS — L814 Other melanin hyperpigmentation: Secondary | ICD-10-CM | POA: Diagnosis not present

## 2019-01-31 DIAGNOSIS — Z79899 Other long term (current) drug therapy: Secondary | ICD-10-CM | POA: Diagnosis not present

## 2019-02-14 DIAGNOSIS — Z1231 Encounter for screening mammogram for malignant neoplasm of breast: Secondary | ICD-10-CM | POA: Diagnosis not present

## 2019-04-04 DIAGNOSIS — Z1159 Encounter for screening for other viral diseases: Secondary | ICD-10-CM | POA: Diagnosis not present

## 2019-04-09 DIAGNOSIS — K640 First degree hemorrhoids: Secondary | ICD-10-CM | POA: Diagnosis not present

## 2019-04-09 DIAGNOSIS — Z86010 Personal history of colonic polyps: Secondary | ICD-10-CM | POA: Diagnosis not present

## 2019-04-10 DIAGNOSIS — H524 Presbyopia: Secondary | ICD-10-CM | POA: Diagnosis not present

## 2019-04-10 DIAGNOSIS — H2513 Age-related nuclear cataract, bilateral: Secondary | ICD-10-CM | POA: Diagnosis not present

## 2019-04-25 DIAGNOSIS — Z23 Encounter for immunization: Secondary | ICD-10-CM | POA: Diagnosis not present

## 2019-05-30 DIAGNOSIS — L649 Androgenic alopecia, unspecified: Secondary | ICD-10-CM | POA: Diagnosis not present

## 2019-05-30 DIAGNOSIS — L650 Telogen effluvium: Secondary | ICD-10-CM | POA: Diagnosis not present

## 2019-05-30 DIAGNOSIS — L639 Alopecia areata, unspecified: Secondary | ICD-10-CM | POA: Diagnosis not present

## 2019-05-30 DIAGNOSIS — Z79899 Other long term (current) drug therapy: Secondary | ICD-10-CM | POA: Diagnosis not present

## 2019-08-05 DIAGNOSIS — Z0000 Encounter for general adult medical examination without abnormal findings: Secondary | ICD-10-CM | POA: Diagnosis not present

## 2019-08-05 DIAGNOSIS — M810 Age-related osteoporosis without current pathological fracture: Secondary | ICD-10-CM | POA: Diagnosis not present

## 2019-08-05 DIAGNOSIS — R7989 Other specified abnormal findings of blood chemistry: Secondary | ICD-10-CM | POA: Diagnosis not present

## 2019-08-11 DIAGNOSIS — Z1212 Encounter for screening for malignant neoplasm of rectum: Secondary | ICD-10-CM | POA: Diagnosis not present

## 2019-08-11 DIAGNOSIS — R82998 Other abnormal findings in urine: Secondary | ICD-10-CM | POA: Diagnosis not present

## 2019-08-12 DIAGNOSIS — F5104 Psychophysiologic insomnia: Secondary | ICD-10-CM | POA: Diagnosis not present

## 2019-08-12 DIAGNOSIS — H938X2 Other specified disorders of left ear: Secondary | ICD-10-CM | POA: Diagnosis not present

## 2019-08-12 DIAGNOSIS — M79641 Pain in right hand: Secondary | ICD-10-CM | POA: Diagnosis not present

## 2019-08-12 DIAGNOSIS — Z0000 Encounter for general adult medical examination without abnormal findings: Secondary | ICD-10-CM | POA: Diagnosis not present

## 2019-08-12 DIAGNOSIS — J310 Chronic rhinitis: Secondary | ICD-10-CM | POA: Diagnosis not present

## 2019-08-12 DIAGNOSIS — M810 Age-related osteoporosis without current pathological fracture: Secondary | ICD-10-CM | POA: Diagnosis not present

## 2019-08-12 DIAGNOSIS — Z1331 Encounter for screening for depression: Secondary | ICD-10-CM | POA: Diagnosis not present

## 2019-08-13 ENCOUNTER — Ambulatory Visit: Payer: Medicare Other | Attending: Internal Medicine

## 2019-08-13 DIAGNOSIS — Z23 Encounter for immunization: Secondary | ICD-10-CM | POA: Insufficient documentation

## 2019-08-13 NOTE — Progress Notes (Signed)
   Covid-19 Vaccination Clinic  Name:  Lauren Thompson    MRN: [id number] DOB: 1946/01/23  08/13/2019  [name] was observed post Covid-19 immunization for 15 minutes without incidence. She was provided with Vaccine Information Sheet and instruction to access the V-Safe system.   [name] was instructed to call 911 with any severe reactions post vaccine: Marland Kitchen Difficulty breathing  . Swelling of your face and throat  . A fast heartbeat  . A bad rash all over your body  . Dizziness and weakness    Immunizations Administered    Name Date Dose VIS Date Route   Pfizer COVID-19 Vaccine 08/13/2019  1:51 PM 0.3 mL 07/04/2019 Intramuscular   Manufacturer: [employer]   Lot: [id number]   [place]: [id number]

## 2019-08-22 DIAGNOSIS — L570 Actinic keratosis: Secondary | ICD-10-CM | POA: Diagnosis not present

## 2019-08-22 DIAGNOSIS — B351 Tinea unguium: Secondary | ICD-10-CM | POA: Diagnosis not present

## 2019-08-31 ENCOUNTER — Ambulatory Visit: Payer: Medicare Other | Attending: Internal Medicine

## 2019-08-31 DIAGNOSIS — Z23 Encounter for immunization: Secondary | ICD-10-CM | POA: Insufficient documentation

## 2019-08-31 NOTE — Progress Notes (Signed)
   Covid-19 Vaccination Clinic  Name:  Lauren Thompson    MRN: [id number] DOB: [birth year]  08/31/2019  [name] was observed post Covid-19 immunization for 15 minutes without incidence. She was provided with Vaccine Information Sheet and instruction to access the V-Safe system.   [name] was instructed to call 911 with any severe reactions post vaccine: Marland Kitchen Difficulty breathing  . Swelling of your face and throat  . A fast heartbeat  . A bad rash all over your body  . Dizziness and weakness    Immunizations Administered    Name Date Dose VIS Date Route   Pfizer COVID-19 Vaccine 08/31/2019  8:10 AM 0.3 mL 07/04/2019 Intramuscular   Manufacturer: [place]   Lot: [id number]   [place]: [id number]

## 2019-09-26 DIAGNOSIS — J3489 Other specified disorders of nose and nasal sinuses: Secondary | ICD-10-CM | POA: Diagnosis not present

## 2019-09-26 DIAGNOSIS — J019 Acute sinusitis, unspecified: Secondary | ICD-10-CM | POA: Diagnosis not present

## 2019-10-14 ENCOUNTER — Encounter: Payer: Self-pay | Admitting: Certified Nurse Midwife

## 2019-11-04 ENCOUNTER — Ambulatory Visit
Admission: EM | Admit: 2019-11-04 | Discharge: 2019-11-04 | Disposition: A | Discharge status: Home / Self Care | Payer: Medicare Other | Attending: Physician Assistant | Admitting: Physician Assistant

## 2019-11-04 DIAGNOSIS — B9689 Other specified bacterial agents as the cause of diseases classified elsewhere: Secondary | ICD-10-CM

## 2019-11-04 DIAGNOSIS — N3090 Cystitis, unspecified without hematuria: Secondary | ICD-10-CM | POA: Diagnosis not present

## 2019-11-04 LAB — POCT URINALYSIS DIP (MANUAL ENTRY)
Bilirubin, UA: NEGATIVE
Glucose, UA: NEGATIVE mg/dL
Ketones, POC UA: NEGATIVE mg/dL
Nitrite, UA: NEGATIVE
Protein Ur, POC: NEGATIVE mg/dL
Spec Grav, UA: >=1.03 — AB (ref 1.010–1.025)
Urobilinogen, UA: 0.2 E.U./dL
pH, UA: 5.5 (ref 5.0–8.0)

## 2019-11-04 MED ORDER — CEPHALEXIN 500 MG PO CAPS: 500.0000 mg | ORAL_CAPSULE | Freq: Three times a day (TID) | ORAL | 0 refills | Status: AC

## 2019-11-04 MED ORDER — PHENAZOPYRIDINE HCL 200 MG PO TABS: 200.0000 mg | ORAL_TABLET | Freq: Three times a day (TID) | ORAL | 0 refills | Status: AC

## 2019-11-04 NOTE — ED Provider Notes (Signed)
[facility]    CSN: [id number] Arrival date & time: 11/04/19  1930      History   Chief Complaint Chief Complaint  Patient presents with  . Urinary Tract Infection    HPI Lauren Thompson is a 74 y.o. female.   74 year old female comes in for 1 day history of urinary symptoms. Has had dysuria, urinary frequency, urinary urgency. Denies hematuria. Denies abdominal pain, nausea, vomiting. Denies fever, chills, flank/back pain. Denies vaginal discharge, itching, spotting. Postmenopausal.       Past Medical History:  Diagnosis Date  . Arthritis    rt index finger  . Atrophic vaginitis   . Idiopathic edema    fluid retention  . MVP (mitral valve prolapse)    Antibiotics are NOT require for procedures  . Osteoporosis 01/29/2017   Spine  . PONV (postoperative nausea and vomiting)     Patient Active Problem List   Diagnosis Date Noted  . Acute sinusitis 02/26/2017  . MVP (mitral valve prolapse)   . Atrophic vaginitis   . Osteopenia     Past Surgical History:  Procedure Laterality Date  . CHOLECYSTECTOMY, LAPAROSCOPIC  1991  . DISTAL INTERPHALANGEAL JOINT FUSION Right 04/27/2014   Procedure: RIGHT INDEX FINGER DISTAL INTERPHALANGEAL JOINT FUSION;  Surgeon: Jolyn Nap, MD;  Location: [facility];  Service: Orthopedics;  Laterality: Right;  . SHOULDER SURGERY Right 12/01/1999  . TONSILLECTOMY AND ADENOIDECTOMY  childhood    OB History    Gravida  2   Para  2   Term  2   Preterm  0   AB  0   Living  2     SAB  0   TAB  0   Ectopic  0   Multiple  0   Live Births  2            Home Medications    Prior to Admission medications   Medication Sig Start Date End Date Taking? Authorizing Provider  aspirin 81 MG tablet Take 81 mg by mouth daily.    [provider]  cephALEXin (KEFLEX) 500 MG capsule Take 1 capsule (500 mg total) by mouth 3 (three) times daily for 7 days. 11/04/19 11/11/19  Ok Edwards, PA-C    phenazopyridine (PYRIDIUM) 200 MG tablet Take 1 tablet (200 mg total) by mouth 3 (three) times daily. 11/04/19   Ok Edwards, PA-C  Prenatal Vit-Fe Fumarate-FA (PRENATAL VITAMIN PO) Take by mouth.    [provider]  Vitamin D, Ergocalciferol, (DRISDOL) 50000 UNITS CAPS capsule TAKE 1 CAPSULE BY MOUTH ONCE WEEKLY Patient taking differently: Take 50,000 Units by mouth every 7 (seven) days. VITAMIN D 10/06/13   Kem Boroughs, FNP    Family History Family History  Problem Relation Age of Onset  . Diabetes Mother   . Hypertension Mother   . Stroke Mother   . Heart disease Father   . Colon cancer Father   . Alzheimer's disease Father   . Stroke Maternal Grandmother   . Diabetes Paternal Grandmother     Social History Social History   Tobacco Use  . Smoking status: Never Smoker  . Smokeless tobacco: Never Used  Substance Use Topics  . Alcohol use: Yes    Comment: 1-2 a month  . Drug use: No     Allergies   Sulfa antibiotics   Review of Systems Review of Systems  Reason unable to perform ROS: See HPI as above.  Physical Exam Triage Vital Signs ED Triage Vitals  Enc Vitals Group     BP 11/04/19 1943 (!) 158/80     Pulse Rate 11/04/19 1943 88     Resp 11/04/19 1943 16     Temp 11/04/19 1943 97.9 F (36.6 C)     Temp Source 11/04/19 1943 Oral     SpO2 11/04/19 1943 96 %     Weight --      Height --      Head Circumference --      Peak Flow --      Pain Score 11/04/19 1944 6     Pain Loc --      Pain Edu? --      Excl. in [place]? --    No data found.  Updated Vital Signs BP (!) 158/80 (BP Location: Left Arm)   Pulse 88   Temp 97.9 F (36.6 C) (Oral)   Resp 16   LMP 07/24/2000 (Approximate)   SpO2 96%   Visual Acuity Right Eye Distance:   Left Eye Distance:   Bilateral Distance:    Right Eye Near:   Left Eye Near:    Bilateral Near:     Physical Exam Constitutional:      General: She is not in acute distress.    Appearance: She is  well-developed. She is not ill-appearing, toxic-appearing or diaphoretic.  HENT:     Head: Normocephalic and atraumatic.  Eyes:     Conjunctiva/sclera: Conjunctivae normal.     Pupils: Pupils are equal, round, and reactive to light.  Cardiovascular:     Rate and Rhythm: Normal rate and regular rhythm.  Pulmonary:     Effort: Pulmonary effort is normal. No respiratory distress.     Comments: LCTAB Abdominal:     General: Bowel sounds are normal.     Palpations: Abdomen is soft.     Tenderness: There is no abdominal tenderness. There is no right CVA tenderness, left CVA tenderness, guarding or rebound.  Musculoskeletal:     Cervical back: Normal range of motion and neck supple.  Skin:    General: Skin is warm and dry.  Neurological:     Mental Status: She is alert and oriented to person, place, and time.  Psychiatric:        Behavior: Behavior normal.        Judgment: Judgment normal.      UC Treatments / Results  Labs (all labs ordered are listed, but only abnormal results are displayed) Labs Reviewed  POCT URINALYSIS DIP (MANUAL ENTRY) - Abnormal; Notable for the following components:      Result Value   Clarity, UA cloudy (*)    Spec Grav, UA >=1.030 (*)    Blood, UA small (*)    Leukocytes, UA Trace (*)    All other components within normal limits  URINE CULTURE    EKG   Radiology No results found.  Procedures Procedures (including critical care time)  Medications Ordered in UC Medications - No data to display  Initial Impression / Assessment and Plan / UC Course  I have reviewed the triage vital signs and the nursing notes.  Pertinent labs & imaging results that were available during my care of the patient were reviewed by me and considered in my medical decision making (see chart for details).    Urine dipstick positive for UTI. Start keflex as directed. Pyridium as needed. Push fluids. Return precautions given.  Final Clinical Impressions(s) / UC  Diagnoses   Final diagnoses:  Cystitis   ED Prescriptions    Medication Sig Dispense Auth. Provider   phenazopyridine (PYRIDIUM) 200 MG tablet Take 1 tablet (200 mg total) by mouth 3 (three) times daily. 6 tablet Neasia Fleeman V, PA-C   cephALEXin (KEFLEX) 500 MG capsule Take 1 capsule (500 mg total) by mouth 3 (three) times daily for 7 days. 21 capsule Ok Edwards, PA-C     PDMP not reviewed this encounter.   Ok Edwards, PA-C 11/04/19 1955

## 2019-11-04 NOTE — Discharge Instructions (Signed)
Your urine was positive for an urinary tract infection. Start keflex as directed. Start pyridium for burning sensation. Keep hydrated, urine should be clear to pale yellow in color. Monitor for any worsening of symptoms, fever, worsening abdominal pain, nausea/vomiting, flank pain, follow up for reevaluation.

## 2019-11-04 NOTE — ED Triage Notes (Signed)
Pt c/o burning and painful on urination with urgency and frequency since this afternoon.

## 2019-11-07 LAB — URINE CULTURE: Culture: >=100000 — AB

## 2020-02-20 DIAGNOSIS — Z1231 Encounter for screening mammogram for malignant neoplasm of breast: Secondary | ICD-10-CM | POA: Diagnosis not present

## 2020-02-26 DIAGNOSIS — L649 Androgenic alopecia, unspecified: Secondary | ICD-10-CM | POA: Diagnosis not present

## 2020-02-26 DIAGNOSIS — L814 Other melanin hyperpigmentation: Secondary | ICD-10-CM | POA: Diagnosis not present

## 2020-02-26 DIAGNOSIS — D225 Melanocytic nevi of trunk: Secondary | ICD-10-CM | POA: Diagnosis not present

## 2020-02-26 DIAGNOSIS — D1801 Hemangioma of skin and subcutaneous tissue: Secondary | ICD-10-CM | POA: Diagnosis not present

## 2020-02-26 DIAGNOSIS — B351 Tinea unguium: Secondary | ICD-10-CM | POA: Diagnosis not present

## 2020-02-26 DIAGNOSIS — L821 Other seborrheic keratosis: Secondary | ICD-10-CM | POA: Diagnosis not present

## 2020-02-26 DIAGNOSIS — D2261 Melanocytic nevi of right upper limb, including shoulder: Secondary | ICD-10-CM | POA: Diagnosis not present

## 2020-03-24 DIAGNOSIS — J018 Other acute sinusitis: Secondary | ICD-10-CM | POA: Diagnosis not present

## 2020-03-24 DIAGNOSIS — J3489 Other specified disorders of nose and nasal sinuses: Secondary | ICD-10-CM | POA: Diagnosis not present

## 2020-03-24 DIAGNOSIS — Z1152 Encounter for screening for COVID-19: Secondary | ICD-10-CM | POA: Diagnosis not present

## 2020-04-12 DIAGNOSIS — H2513 Age-related nuclear cataract, bilateral: Secondary | ICD-10-CM | POA: Diagnosis not present

## 2020-04-12 DIAGNOSIS — H35363 Drusen (degenerative) of macula, bilateral: Secondary | ICD-10-CM | POA: Diagnosis not present

## 2020-04-12 DIAGNOSIS — H524 Presbyopia: Secondary | ICD-10-CM | POA: Diagnosis not present

## 2020-04-12 DIAGNOSIS — H43813 Vitreous degeneration, bilateral: Secondary | ICD-10-CM | POA: Diagnosis not present

## 2020-05-01 DIAGNOSIS — Z23 Encounter for immunization: Secondary | ICD-10-CM | POA: Diagnosis not present

## 2020-05-26 DIAGNOSIS — N390 Urinary tract infection, site not specified: Secondary | ICD-10-CM | POA: Diagnosis not present

## 2020-05-26 DIAGNOSIS — R300 Dysuria: Secondary | ICD-10-CM | POA: Diagnosis not present

## 2020-06-03 DIAGNOSIS — Z23 Encounter for immunization: Secondary | ICD-10-CM | POA: Diagnosis not present

## 2020-07-01 DIAGNOSIS — Z20822 Contact with and (suspected) exposure to covid-19: Secondary | ICD-10-CM | POA: Diagnosis not present

## 2020-08-10 DIAGNOSIS — M810 Age-related osteoporosis without current pathological fracture: Secondary | ICD-10-CM | POA: Diagnosis not present

## 2020-08-10 DIAGNOSIS — J329 Chronic sinusitis, unspecified: Secondary | ICD-10-CM | POA: Diagnosis not present

## 2020-08-17 DIAGNOSIS — F5104 Psychophysiologic insomnia: Secondary | ICD-10-CM | POA: Diagnosis not present

## 2020-08-17 DIAGNOSIS — Z1339 Encounter for screening examination for other mental health and behavioral disorders: Secondary | ICD-10-CM | POA: Diagnosis not present

## 2020-08-17 DIAGNOSIS — R0982 Postnasal drip: Secondary | ICD-10-CM | POA: Diagnosis not present

## 2020-08-17 DIAGNOSIS — Z1331 Encounter for screening for depression: Secondary | ICD-10-CM | POA: Diagnosis not present

## 2020-08-17 DIAGNOSIS — M810 Age-related osteoporosis without current pathological fracture: Secondary | ICD-10-CM | POA: Diagnosis not present

## 2020-08-17 DIAGNOSIS — R82998 Other abnormal findings in urine: Secondary | ICD-10-CM | POA: Diagnosis not present

## 2020-08-17 DIAGNOSIS — M79644 Pain in right finger(s): Secondary | ICD-10-CM | POA: Diagnosis not present

## 2020-08-17 DIAGNOSIS — M79645 Pain in left finger(s): Secondary | ICD-10-CM | POA: Diagnosis not present

## 2020-08-17 DIAGNOSIS — Z0000 Encounter for general adult medical examination without abnormal findings: Secondary | ICD-10-CM | POA: Diagnosis not present

## 2020-08-17 DIAGNOSIS — G8929 Other chronic pain: Secondary | ICD-10-CM | POA: Diagnosis not present

## 2020-08-17 DIAGNOSIS — Z1212 Encounter for screening for malignant neoplasm of rectum: Secondary | ICD-10-CM | POA: Diagnosis not present

## 2020-08-17 DIAGNOSIS — M542 Cervicalgia: Secondary | ICD-10-CM | POA: Diagnosis not present

## 2020-11-08 DIAGNOSIS — Z20822 Contact with and (suspected) exposure to covid-19: Secondary | ICD-10-CM | POA: Diagnosis not present

## 2020-12-09 DIAGNOSIS — L578 Other skin changes due to chronic exposure to nonionizing radiation: Secondary | ICD-10-CM | POA: Diagnosis not present

## 2020-12-09 DIAGNOSIS — B351 Tinea unguium: Secondary | ICD-10-CM | POA: Diagnosis not present

## 2020-12-09 DIAGNOSIS — L649 Androgenic alopecia, unspecified: Secondary | ICD-10-CM | POA: Diagnosis not present

## 2020-12-17 DIAGNOSIS — M810 Age-related osteoporosis without current pathological fracture: Secondary | ICD-10-CM | POA: Diagnosis not present

## 2020-12-17 DIAGNOSIS — M8589 Other specified disorders of bone density and structure, multiple sites: Secondary | ICD-10-CM | POA: Diagnosis not present

## 2020-12-17 DIAGNOSIS — Z780 Asymptomatic menopausal state: Secondary | ICD-10-CM | POA: Diagnosis not present

## 2021-03-01 DIAGNOSIS — Z1231 Encounter for screening mammogram for malignant neoplasm of breast: Secondary | ICD-10-CM | POA: Diagnosis not present

## 2021-03-07 DIAGNOSIS — Z23 Encounter for immunization: Secondary | ICD-10-CM | POA: Diagnosis not present

## 2021-04-04 DIAGNOSIS — B351 Tinea unguium: Secondary | ICD-10-CM | POA: Diagnosis not present

## 2021-04-04 DIAGNOSIS — M792 Neuralgia and neuritis, unspecified: Secondary | ICD-10-CM | POA: Diagnosis not present

## 2021-04-04 DIAGNOSIS — L603 Nail dystrophy: Secondary | ICD-10-CM | POA: Diagnosis not present

## 2021-04-04 DIAGNOSIS — M79674 Pain in right toe(s): Secondary | ICD-10-CM | POA: Diagnosis not present

## 2021-04-04 DIAGNOSIS — L601 Onycholysis: Secondary | ICD-10-CM | POA: Diagnosis not present

## 2021-04-13 DIAGNOSIS — H35363 Drusen (degenerative) of macula, bilateral: Secondary | ICD-10-CM | POA: Diagnosis not present

## 2021-04-13 DIAGNOSIS — H43813 Vitreous degeneration, bilateral: Secondary | ICD-10-CM | POA: Diagnosis not present

## 2021-04-13 DIAGNOSIS — H2513 Age-related nuclear cataract, bilateral: Secondary | ICD-10-CM | POA: Diagnosis not present

## 2021-04-13 DIAGNOSIS — L603 Nail dystrophy: Secondary | ICD-10-CM | POA: Diagnosis not present

## 2021-04-13 DIAGNOSIS — B351 Tinea unguium: Secondary | ICD-10-CM | POA: Diagnosis not present

## 2021-04-13 DIAGNOSIS — H524 Presbyopia: Secondary | ICD-10-CM | POA: Diagnosis not present

## 2021-06-06 DIAGNOSIS — R0981 Nasal congestion: Secondary | ICD-10-CM | POA: Diagnosis not present

## 2021-06-06 DIAGNOSIS — R5383 Other fatigue: Secondary | ICD-10-CM | POA: Diagnosis not present

## 2021-06-06 DIAGNOSIS — U071 COVID-19: Secondary | ICD-10-CM | POA: Diagnosis not present

## 2021-06-06 DIAGNOSIS — J029 Acute pharyngitis, unspecified: Secondary | ICD-10-CM | POA: Diagnosis not present

## 2021-06-06 DIAGNOSIS — Z1152 Encounter for screening for COVID-19: Secondary | ICD-10-CM | POA: Diagnosis not present

## 2021-06-07 ENCOUNTER — Ambulatory Visit: Payer: Self-pay

## 2021-06-13 DIAGNOSIS — L649 Androgenic alopecia, unspecified: Secondary | ICD-10-CM | POA: Diagnosis not present

## 2021-06-13 DIAGNOSIS — L570 Actinic keratosis: Secondary | ICD-10-CM | POA: Diagnosis not present

## 2021-06-13 DIAGNOSIS — M792 Neuralgia and neuritis, unspecified: Secondary | ICD-10-CM | POA: Diagnosis not present

## 2021-06-13 DIAGNOSIS — L821 Other seborrheic keratosis: Secondary | ICD-10-CM | POA: Diagnosis not present

## 2021-06-13 DIAGNOSIS — B351 Tinea unguium: Secondary | ICD-10-CM | POA: Diagnosis not present

## 2021-06-13 DIAGNOSIS — Z20822 Contact with and (suspected) exposure to covid-19: Secondary | ICD-10-CM | POA: Diagnosis not present

## 2021-06-23 DIAGNOSIS — D2261 Melanocytic nevi of right upper limb, including shoulder: Secondary | ICD-10-CM | POA: Diagnosis not present

## 2021-06-23 DIAGNOSIS — B070 Plantar wart: Secondary | ICD-10-CM | POA: Diagnosis not present

## 2021-06-23 DIAGNOSIS — D225 Melanocytic nevi of trunk: Secondary | ICD-10-CM | POA: Diagnosis not present

## 2021-06-23 DIAGNOSIS — D0471 Carcinoma in situ of skin of right lower limb, including hip: Secondary | ICD-10-CM | POA: Diagnosis not present

## 2021-06-23 DIAGNOSIS — L814 Other melanin hyperpigmentation: Secondary | ICD-10-CM | POA: Diagnosis not present

## 2021-06-23 DIAGNOSIS — L821 Other seborrheic keratosis: Secondary | ICD-10-CM | POA: Diagnosis not present

## 2021-06-23 DIAGNOSIS — D1801 Hemangioma of skin and subcutaneous tissue: Secondary | ICD-10-CM | POA: Diagnosis not present

## 2021-06-23 DIAGNOSIS — L820 Inflamed seborrheic keratosis: Secondary | ICD-10-CM | POA: Diagnosis not present

## 2021-06-23 DIAGNOSIS — D485 Neoplasm of uncertain behavior of skin: Secondary | ICD-10-CM | POA: Diagnosis not present

## 2021-06-23 DIAGNOSIS — L570 Actinic keratosis: Secondary | ICD-10-CM | POA: Diagnosis not present

## 2021-06-29 DIAGNOSIS — M792 Neuralgia and neuritis, unspecified: Secondary | ICD-10-CM | POA: Diagnosis not present

## 2021-06-29 DIAGNOSIS — B351 Tinea unguium: Secondary | ICD-10-CM | POA: Diagnosis not present

## 2021-07-12 DIAGNOSIS — D0471 Carcinoma in situ of skin of right lower limb, including hip: Secondary | ICD-10-CM | POA: Diagnosis not present

## 2021-07-20 DIAGNOSIS — M792 Neuralgia and neuritis, unspecified: Secondary | ICD-10-CM | POA: Diagnosis not present

## 2021-07-20 DIAGNOSIS — B351 Tinea unguium: Secondary | ICD-10-CM | POA: Diagnosis not present

## 2021-07-27 DIAGNOSIS — D692 Other nonthrombocytopenic purpura: Secondary | ICD-10-CM | POA: Diagnosis not present

## 2021-07-27 DIAGNOSIS — L111 Transient acantholytic dermatosis [Grover]: Secondary | ICD-10-CM | POA: Diagnosis not present

## 2021-08-11 DIAGNOSIS — B070 Plantar wart: Secondary | ICD-10-CM | POA: Diagnosis not present

## 2021-08-11 DIAGNOSIS — L814 Other melanin hyperpigmentation: Secondary | ICD-10-CM | POA: Diagnosis not present

## 2021-08-19 DIAGNOSIS — R7989 Other specified abnormal findings of blood chemistry: Secondary | ICD-10-CM | POA: Diagnosis not present

## 2021-08-19 DIAGNOSIS — M810 Age-related osteoporosis without current pathological fracture: Secondary | ICD-10-CM | POA: Diagnosis not present

## 2021-08-19 DIAGNOSIS — J310 Chronic rhinitis: Secondary | ICD-10-CM | POA: Diagnosis not present

## 2021-09-02 DIAGNOSIS — M542 Cervicalgia: Secondary | ICD-10-CM | POA: Diagnosis not present

## 2021-09-02 DIAGNOSIS — Z0000 Encounter for general adult medical examination without abnormal findings: Secondary | ICD-10-CM | POA: Diagnosis not present

## 2021-09-02 DIAGNOSIS — M810 Age-related osteoporosis without current pathological fracture: Secondary | ICD-10-CM | POA: Diagnosis not present

## 2021-09-02 DIAGNOSIS — R82998 Other abnormal findings in urine: Secondary | ICD-10-CM | POA: Diagnosis not present

## 2021-09-02 DIAGNOSIS — F5104 Psychophysiologic insomnia: Secondary | ICD-10-CM | POA: Diagnosis not present

## 2021-09-02 DIAGNOSIS — M79644 Pain in right finger(s): Secondary | ICD-10-CM | POA: Diagnosis not present

## 2021-09-02 DIAGNOSIS — Z1212 Encounter for screening for malignant neoplasm of rectum: Secondary | ICD-10-CM | POA: Diagnosis not present

## 2021-09-14 DIAGNOSIS — B070 Plantar wart: Secondary | ICD-10-CM | POA: Diagnosis not present

## 2022-03-07 DIAGNOSIS — Z1231 Encounter for screening mammogram for malignant neoplasm of breast: Secondary | ICD-10-CM | POA: Diagnosis not present

## 2022-03-13 DIAGNOSIS — R6884 Jaw pain: Secondary | ICD-10-CM | POA: Diagnosis not present

## 2022-03-13 DIAGNOSIS — J329 Chronic sinusitis, unspecified: Secondary | ICD-10-CM | POA: Diagnosis not present

## 2022-04-19 DIAGNOSIS — H35363 Drusen (degenerative) of macula, bilateral: Secondary | ICD-10-CM | POA: Diagnosis not present

## 2022-04-19 DIAGNOSIS — H43813 Vitreous degeneration, bilateral: Secondary | ICD-10-CM | POA: Diagnosis not present

## 2022-04-19 DIAGNOSIS — H524 Presbyopia: Secondary | ICD-10-CM | POA: Diagnosis not present

## 2022-04-19 DIAGNOSIS — H2513 Age-related nuclear cataract, bilateral: Secondary | ICD-10-CM | POA: Diagnosis not present

## 2022-05-09 DIAGNOSIS — Z23 Encounter for immunization: Secondary | ICD-10-CM | POA: Diagnosis not present

## 2022-06-12 ENCOUNTER — Ambulatory Visit
Admission: EM | Admit: 2022-06-12 | Discharge: 2022-06-12 | Disposition: A | Discharge status: Home / Self Care | Payer: Medicare Other | Attending: Physician Assistant | Admitting: Physician Assistant

## 2022-06-12 DIAGNOSIS — J0100 Acute maxillary sinusitis, unspecified: Secondary | ICD-10-CM

## 2022-06-12 DIAGNOSIS — H6122 Impacted cerumen, left ear: Secondary | ICD-10-CM | POA: Diagnosis not present

## 2022-06-12 DIAGNOSIS — H9202 Otalgia, left ear: Secondary | ICD-10-CM

## 2022-06-12 MED ORDER — AMOXICILLIN-POT CLAVULANATE 875-125 MG PO TABS: 1.0000 | ORAL_TABLET | Freq: Two times a day (BID) | ORAL | 0 refills | Status: AC

## 2022-06-12 NOTE — ED Triage Notes (Signed)
Pt c/o nasal congestion, sinus pressure, feeling like nose is burning, left ear pressure,   Onset ~ 4-5 days ago

## 2022-06-12 NOTE — ED Provider Notes (Signed)
[facility]    CSN: [id number] Arrival date & time: 06/12/22  0855      History   Chief Complaint Chief Complaint  Patient presents with   sinus pressure    HPI Lauren Thompson is a 76 y.o. female.   Patient here today for evaluation of sinus congestion and maxillary pressure specifically to her left side.  She notes she has also had some left ear pain as well.  She denies any nausea or vomiting but has had some cough.  She notes that symptoms have been ongoing for at least 5 days.  She does have history of sinus issues.  The history is provided by the patient.    Past Medical History:  Diagnosis Date   Arthritis    rt index finger   Atrophic vaginitis    Idiopathic edema    fluid retention   MVP (mitral valve prolapse)    Antibiotics are NOT require for procedures   Osteoporosis 01/29/2017   Spine   PONV (postoperative nausea and vomiting)     Patient Active Problem List   Diagnosis Date Noted   Acute sinusitis 02/26/2017   MVP (mitral valve prolapse)    Atrophic vaginitis    Osteopenia     Past Surgical History:  Procedure Laterality Date   CHOLECYSTECTOMY, LAPAROSCOPIC  1991   DISTAL INTERPHALANGEAL JOINT FUSION Right 04/27/2014   Procedure: RIGHT INDEX FINGER DISTAL INTERPHALANGEAL JOINT FUSION;  Surgeon: Jolyn Nap, MD;  Location: [place];  Service: Orthopedics;  Laterality: Right;   SHOULDER SURGERY Right 12/01/1999   TONSILLECTOMY AND ADENOIDECTOMY  childhood    OB History     Gravida  2   Para  2   Term  2   Preterm  0   AB  0   Living  2      SAB  0   IAB  0   Ectopic  0   Multiple  0   Live Births  2            Home Medications    Prior to Admission medications   Medication Sig Start Date End Date Taking? Authorizing Provider  amoxicillin-clavulanate (AUGMENTIN) 875-125 MG tablet Take 1 tablet by mouth every 12 (twelve) hours. 06/12/22  Yes Francene Finders, PA-C  aspirin 81 MG  tablet Take 81 mg by mouth daily.    [provider]  phenazopyridine (PYRIDIUM) 200 MG tablet Take 1 tablet (200 mg total) by mouth 3 (three) times daily. 11/04/19   Ok Edwards, PA-C  Prenatal Vit-Fe Fumarate-FA (PRENATAL VITAMIN PO) Take by mouth.    [provider]  Vitamin D, Ergocalciferol, (DRISDOL) 50000 UNITS CAPS capsule TAKE 1 CAPSULE BY MOUTH ONCE WEEKLY Patient taking differently: Take 50,000 Units by mouth every 7 (seven) days. VITAMIN D 10/06/13   Kem Boroughs, FNP    Family History Family History  Problem Relation Age of Onset   Diabetes Mother    Hypertension Mother    Stroke Mother    Heart disease Father    Colon cancer Father    Alzheimer's disease Father    Stroke Maternal Grandmother    Diabetes Paternal Grandmother     Social History Social History   Tobacco Use   Smoking status: Never   Smokeless tobacco: Never  Vaping Use   Vaping Use: Never used  Substance Use Topics   Alcohol use: Yes    Comment: 1-2 a month   Drug  use: No     Allergies   Sulfa antibiotics   Review of Systems Review of Systems  Constitutional:  Negative for chills and fever.  HENT:  Positive for congestion, sinus pressure and sore throat. Negative for ear pain.   Eyes:  Negative for discharge and redness.  Respiratory:  Positive for cough. Negative for shortness of breath and wheezing.   Gastrointestinal:  Negative for abdominal pain, diarrhea, nausea and vomiting.     Physical Exam Triage Vital Signs ED Triage Vitals [06/12/22 1029]  Enc Vitals Group     BP 111/76     Pulse Rate 92     Resp 16     Temp 98.3 F (36.8 C)     Temp Source Oral     SpO2 97 %     Weight      Height      Head Circumference      Peak Flow      Pain Score 8     Pain Loc      Pain Edu?      Excl. in [place]?    No data found.  Updated Vital Signs BP 111/76 (BP Location: Right Arm)   Pulse 92   Temp 98.3 F (36.8 C) (Oral)   Resp 16   LMP 07/24/2000  (Approximate)   SpO2 97%      Physical Exam Vitals and nursing note reviewed.  Constitutional:      General: She is not in acute distress.    Appearance: Normal appearance. She is not ill-appearing.  HENT:     Head: Normocephalic and atraumatic.     Ears:     Comments: Right TM dull, left TM not visualized due to cerumen    Nose: Congestion present.     Mouth/Throat:     Mouth: Mucous membranes are moist.     Pharynx: No oropharyngeal exudate or posterior oropharyngeal erythema.  Eyes:     Conjunctiva/sclera: Conjunctivae normal.  Cardiovascular:     Rate and Rhythm: Normal rate and regular rhythm.     Heart sounds: Normal heart sounds. No murmur heard. Pulmonary:     Effort: Pulmonary effort is normal. No respiratory distress.     Breath sounds: Normal breath sounds. No wheezing, rhonchi or rales.  Skin:    General: Skin is warm and dry.  Neurological:     Mental Status: She is alert.  Psychiatric:        Mood and Affect: Mood normal.        Thought Content: Thought content normal.      UC Treatments / Results  Labs (all labs ordered are listed, but only abnormal results are displayed) Labs Reviewed - No data to display  EKG   Radiology No results found.  Procedures Procedures (including critical care time)  Medications Ordered in UC Medications - No data to display  Initial Impression / Assessment and Plan / UC Course  I have reviewed the triage vital signs and the nursing notes.  Pertinent labs & imaging results that were available during my care of the patient were reviewed by me and considered in my medical decision making (see chart for details).    Antibiotic prescribed to cover sinusitis.  Discussed option of cerumen removal through irrigation however if there is otitis media present, discussed that this may be very painful.  Recommended patient take antibiotic and use Debrox drops at home for wax removal. If ear pain continues follow-up for  irrigation after  completing antibiotic course.  Patient expresses understanding and agrees.  Final Clinical Impressions(s) / UC Diagnoses   Final diagnoses:  Acute maxillary sinusitis, recurrence not specified  Hearing loss due to cerumen impaction, left  Acute otalgia, left   Discharge Instructions   None    ED Prescriptions     Medication Sig Dispense Auth. Provider   amoxicillin-clavulanate (AUGMENTIN) 875-125 MG tablet Take 1 tablet by mouth every 12 (twelve) hours. 14 tablet Francene Finders, PA-C      PDMP not reviewed this encounter.   Francene Finders, PA-C 06/12/22 1341

## 2022-06-14 DIAGNOSIS — J343 Hypertrophy of nasal turbinates: Secondary | ICD-10-CM | POA: Diagnosis not present

## 2022-06-14 DIAGNOSIS — J310 Chronic rhinitis: Secondary | ICD-10-CM | POA: Diagnosis not present

## 2022-06-14 DIAGNOSIS — H9012 Conductive hearing loss, unilateral, left ear, with unrestricted hearing on the contralateral side: Secondary | ICD-10-CM | POA: Diagnosis not present

## 2022-06-14 DIAGNOSIS — H6122 Impacted cerumen, left ear: Secondary | ICD-10-CM | POA: Diagnosis not present

## 2022-06-14 DIAGNOSIS — R0982 Postnasal drip: Secondary | ICD-10-CM | POA: Diagnosis not present

## 2022-07-05 DIAGNOSIS — D1801 Hemangioma of skin and subcutaneous tissue: Secondary | ICD-10-CM | POA: Diagnosis not present

## 2022-07-05 DIAGNOSIS — D225 Melanocytic nevi of trunk: Secondary | ICD-10-CM | POA: Diagnosis not present

## 2022-07-05 DIAGNOSIS — L570 Actinic keratosis: Secondary | ICD-10-CM | POA: Diagnosis not present

## 2022-07-05 DIAGNOSIS — L821 Other seborrheic keratosis: Secondary | ICD-10-CM | POA: Diagnosis not present

## 2022-07-05 DIAGNOSIS — D2262 Melanocytic nevi of left upper limb, including shoulder: Secondary | ICD-10-CM | POA: Diagnosis not present

## 2022-07-05 DIAGNOSIS — L814 Other melanin hyperpigmentation: Secondary | ICD-10-CM | POA: Diagnosis not present

## 2022-07-05 DIAGNOSIS — Z85828 Personal history of other malignant neoplasm of skin: Secondary | ICD-10-CM | POA: Diagnosis not present

## 2022-08-08 DIAGNOSIS — M79641 Pain in right hand: Secondary | ICD-10-CM | POA: Diagnosis not present

## 2022-08-08 DIAGNOSIS — M79642 Pain in left hand: Secondary | ICD-10-CM | POA: Diagnosis not present

## 2022-10-17 DIAGNOSIS — M810 Age-related osteoporosis without current pathological fracture: Secondary | ICD-10-CM | POA: Diagnosis not present

## 2022-10-17 DIAGNOSIS — R7989 Other specified abnormal findings of blood chemistry: Secondary | ICD-10-CM | POA: Diagnosis not present

## 2022-10-17 DIAGNOSIS — J329 Chronic sinusitis, unspecified: Secondary | ICD-10-CM | POA: Diagnosis not present

## 2022-10-24 DIAGNOSIS — Z1339 Encounter for screening examination for other mental health and behavioral disorders: Secondary | ICD-10-CM | POA: Diagnosis not present

## 2022-10-24 DIAGNOSIS — M542 Cervicalgia: Secondary | ICD-10-CM | POA: Diagnosis not present

## 2022-10-24 DIAGNOSIS — M810 Age-related osteoporosis without current pathological fracture: Secondary | ICD-10-CM | POA: Diagnosis not present

## 2022-10-24 DIAGNOSIS — F5104 Psychophysiologic insomnia: Secondary | ICD-10-CM | POA: Diagnosis not present

## 2022-10-24 DIAGNOSIS — Z0000 Encounter for general adult medical examination without abnormal findings: Secondary | ICD-10-CM | POA: Diagnosis not present

## 2022-10-24 DIAGNOSIS — M79645 Pain in left finger(s): Secondary | ICD-10-CM | POA: Diagnosis not present

## 2022-10-24 DIAGNOSIS — M79644 Pain in right finger(s): Secondary | ICD-10-CM | POA: Diagnosis not present

## 2022-10-24 DIAGNOSIS — Z1331 Encounter for screening for depression: Secondary | ICD-10-CM | POA: Diagnosis not present

## 2022-10-24 DIAGNOSIS — J310 Chronic rhinitis: Secondary | ICD-10-CM | POA: Diagnosis not present

## 2022-10-24 DIAGNOSIS — R82998 Other abnormal findings in urine: Secondary | ICD-10-CM | POA: Diagnosis not present

## 2022-10-26 DIAGNOSIS — Z1212 Encounter for screening for malignant neoplasm of rectum: Secondary | ICD-10-CM | POA: Diagnosis not present

## 2023-01-12 DIAGNOSIS — M792 Neuralgia and neuritis, unspecified: Secondary | ICD-10-CM | POA: Diagnosis not present

## 2023-01-12 DIAGNOSIS — L601 Onycholysis: Secondary | ICD-10-CM | POA: Diagnosis not present

## 2023-01-12 DIAGNOSIS — B351 Tinea unguium: Secondary | ICD-10-CM | POA: Diagnosis not present

## 2023-01-31 DIAGNOSIS — L821 Other seborrheic keratosis: Secondary | ICD-10-CM | POA: Diagnosis not present

## 2023-01-31 DIAGNOSIS — L820 Inflamed seborrheic keratosis: Secondary | ICD-10-CM | POA: Diagnosis not present

## 2023-01-31 DIAGNOSIS — L814 Other melanin hyperpigmentation: Secondary | ICD-10-CM | POA: Diagnosis not present

## 2023-01-31 DIAGNOSIS — D1801 Hemangioma of skin and subcutaneous tissue: Secondary | ICD-10-CM | POA: Diagnosis not present

## 2023-03-13 DIAGNOSIS — M8588 Other specified disorders of bone density and structure, other site: Secondary | ICD-10-CM | POA: Diagnosis not present

## 2023-03-13 DIAGNOSIS — N958 Other specified menopausal and perimenopausal disorders: Secondary | ICD-10-CM | POA: Diagnosis not present

## 2023-03-13 DIAGNOSIS — Z1231 Encounter for screening mammogram for malignant neoplasm of breast: Secondary | ICD-10-CM | POA: Diagnosis not present

## 2023-04-23 DIAGNOSIS — H43813 Vitreous degeneration, bilateral: Secondary | ICD-10-CM | POA: Diagnosis not present

## 2023-04-23 DIAGNOSIS — H524 Presbyopia: Secondary | ICD-10-CM | POA: Diagnosis not present

## 2023-04-23 DIAGNOSIS — H35363 Drusen (degenerative) of macula, bilateral: Secondary | ICD-10-CM | POA: Diagnosis not present

## 2023-04-23 DIAGNOSIS — H25813 Combined forms of age-related cataract, bilateral: Secondary | ICD-10-CM | POA: Diagnosis not present

## 2023-04-26 DIAGNOSIS — Z23 Encounter for immunization: Secondary | ICD-10-CM | POA: Diagnosis not present

## 2023-05-03 ENCOUNTER — Encounter (INDEPENDENT_AMBULATORY_CARE_PROVIDER_SITE_OTHER): Payer: Self-pay

## 2023-05-03 ENCOUNTER — Ambulatory Visit (INDEPENDENT_AMBULATORY_CARE_PROVIDER_SITE_OTHER): Payer: Medicare Other | Admitting: Otolaryngology

## 2023-05-03 VITALS — Ht 65.0 in | Wt 125.0 lb

## 2023-05-03 DIAGNOSIS — J343 Hypertrophy of nasal turbinates: Secondary | ICD-10-CM

## 2023-05-03 DIAGNOSIS — J0101 Acute recurrent maxillary sinusitis: Secondary | ICD-10-CM

## 2023-05-05 DIAGNOSIS — J0101 Acute recurrent maxillary sinusitis: Secondary | ICD-10-CM | POA: Insufficient documentation

## 2023-05-05 DIAGNOSIS — J343 Hypertrophy of nasal turbinates: Secondary | ICD-10-CM | POA: Insufficient documentation

## 2023-05-05 NOTE — Progress Notes (Signed)
Patient ID: Lauren Thompson, female   DOB: Nov 02, 1945, 77 y.o.   MRN: [id number]  Cc: Acute sinusitis  Procedure:  Flexible Nasal Endoscopy  Indication: The patient is a 77 year old female who presents today complaining of an acute sinusitis.  According to the patient, she started noticing increasing nasal congestion and facial pain 4 weeks ago.  She complains of burning sensation in her nose, with frequent postnasal drainage.  She denies any fever or visual change.  Anesthesia: None  Description: Risks, benefits, and alternatives of flexible endoscopy were explained to the patient.  Specific mention was made of the risk of throat numbness with difficulty swallowing, possible bleeding from the nose and mouth, and pain from the procedure.  The patient gave oral consent to proceed.  The flexible scope was inserted into the right nasal cavity.  Endoscopy of the interior nasal cavity, superior, inferior, and middle meatus was performed. The sphenoid-ethmoid recess was examined.  Erythematous and edematous mucosa was noted.  No polyp, mass, or lesion was appreciated. Olfactory cleft was clear.  Nasopharynx was clear.  Turbinates were hypertrophied but without mass. The procedure was repeated on the contralateral side with similar findings.  The patient tolerated the procedure well.   Assessment: 1.  Acute rhinosinusitis, with erythematous and edematous nasal mucosa. 2.  Bilateral inferior turbinate hypertrophy.  Plan: 1.  The nasal endoscopy findings are reviewed with the patient. 2.  Azithromycin Dosepak for 5 days. 3.  Flonase nasal spray 2 sprays each nostril daily. 4.  The patient is encouraged to call with any questions or concerns.

## 2023-06-27 DIAGNOSIS — N390 Urinary tract infection, site not specified: Secondary | ICD-10-CM | POA: Diagnosis not present

## 2023-07-04 DIAGNOSIS — N644 Mastodynia: Secondary | ICD-10-CM | POA: Diagnosis not present

## 2023-07-04 DIAGNOSIS — R0781 Pleurodynia: Secondary | ICD-10-CM | POA: Diagnosis not present

## 2023-07-04 DIAGNOSIS — R3129 Other microscopic hematuria: Secondary | ICD-10-CM | POA: Diagnosis not present

## 2023-07-06 DIAGNOSIS — R350 Frequency of micturition: Secondary | ICD-10-CM | POA: Diagnosis not present

## 2023-07-06 DIAGNOSIS — R8271 Bacteriuria: Secondary | ICD-10-CM | POA: Diagnosis not present

## 2024-04-08 ENCOUNTER — Ambulatory Visit (INDEPENDENT_AMBULATORY_CARE_PROVIDER_SITE_OTHER): Admitting: Otolaryngology

## 2024-04-08 ENCOUNTER — Encounter (INDEPENDENT_AMBULATORY_CARE_PROVIDER_SITE_OTHER): Payer: Self-pay | Admitting: Otolaryngology

## 2024-04-08 VITALS — BP 131/82 | HR 84

## 2024-04-08 DIAGNOSIS — J310 Chronic rhinitis: Secondary | ICD-10-CM | POA: Diagnosis not present

## 2024-04-08 DIAGNOSIS — R0981 Nasal congestion: Secondary | ICD-10-CM | POA: Diagnosis not present

## 2024-04-08 DIAGNOSIS — J343 Hypertrophy of nasal turbinates: Secondary | ICD-10-CM

## 2024-04-08 MED ORDER — FLUTICASONE PROPIONATE 50 MCG/ACT NA SUSP: 2.0000 | Freq: Every day | NASAL | 10 refills | Status: AC

## 2024-04-09 DIAGNOSIS — J310 Chronic rhinitis: Secondary | ICD-10-CM | POA: Insufficient documentation

## 2024-04-09 NOTE — Progress Notes (Signed)
 Patient ID: Lauren Thompson, female   DOB: 12-Jan-1946, 78 y.o.   MRN: [id number]  Follow-up: Recurrent sinusitis, chronic nasal congestion  HPI: The patient is a 78 year old female who returns today complaining of increasing nasal congestion and nasal drainage over the past month.  She was last seen in October 2024.  At that time, she was noted to have an acute sinusitis, with erythematous and edematous nasal mucosa.  She was treated with azithromycin and Flonase  nasal spray.  According to the patient, she was doing well without any nasal symptoms or recurrent sinusitis until 1 month ago.  At that time, she noted increasing nasal congestion and nasal drainage.  Currently she denies any fever or visual change.  Exam: General: Communicates without difficulty, well nourished, no acute distress. Head: Normocephalic, no evidence injury, no tenderness, facial buttresses intact without stepoff. Face/sinus: No tenderness to palpation and percussion. Facial movement is normal and symmetric. Eyes: PERRL, EOMI. No scleral icterus, conjunctivae clear. Neuro: CN II exam reveals vision grossly intact.  No nystagmus at any point of gaze. Ears: Auricles well formed without lesions.  Ear canals are intact without mass or lesion.  No erythema or edema is appreciated.  The TMs are intact without fluid. Nose: External evaluation reveals normal support and skin without lesions.  Dorsum is intact.  Anterior rhinoscopy reveals congested mucosa over anterior aspect of inferior turbinates and intact septum.  No purulence noted. Oral:  Oral cavity and oropharynx are intact, symmetric, without erythema or edema.  Mucosa is moist without lesions. Neck: Full range of motion without pain.  There is no significant lymphadenopathy.  No masses palpable.  Thyroid  bed within normal limits to palpation.  Parotid glands and submandibular glands equal bilaterally without mass.  Trachea is midline. Neuro:  CN 2-12 grossly intact.    Assessment: 1.  Chronic rhinitis with nasal mucosal congestion and bilateral inferior turbinate hypertrophy. 2.  No acute infection is noted today.  Plan: 1.  The physical exam findings are reviewed with the patient. 2.  The patient is reassured and no acute infection is noted today. 3.  Flonase  nasal spray 2 sprays each nostril daily.  The importance of consistent daily use is discussed. 4.  The patient is encouraged to call with any questions or concerns.
